# Patient Record
Sex: Female | Born: 1960
Health system: Southern US, Community
[De-identification: ages and names within clinical notes are randomized; demographics above are authoritative.]

## PROBLEM LIST (undated history)

## (undated) DIAGNOSIS — M659 Synovitis and tenosynovitis, unspecified: Secondary | ICD-10-CM

## (undated) DIAGNOSIS — M797 Fibromyalgia: Secondary | ICD-10-CM

## (undated) DIAGNOSIS — K219 Gastro-esophageal reflux disease without esophagitis: Secondary | ICD-10-CM

## (undated) HISTORY — PX: TUBAL LIGATION: SHX77

## (undated) HISTORY — PX: CHOLECYSTECTOMY: SHX55

## (undated) HISTORY — PX: APPENDECTOMY: SHX54

## (undated) HISTORY — DX: Synovitis and tenosynovitis, unspecified: M65.9

## (undated) HISTORY — DX: Gastro-esophageal reflux disease without esophagitis: K21.9

## (undated) HISTORY — DX: Fibromyalgia: M79.7

---

## 1997-12-05 ENCOUNTER — Ambulatory Visit (HOSPITAL_COMMUNITY): Admission: RE | Admit: 1997-12-05 | Discharge: 1997-12-05 | Payer: Self-pay | Admitting: Obstetrics & Gynecology

## 2000-10-10 ENCOUNTER — Other Ambulatory Visit: Admission: RE | Admit: 2000-10-10 | Discharge: 2000-10-10 | Payer: Self-pay | Admitting: Obstetrics & Gynecology

## 2000-10-26 ENCOUNTER — Other Ambulatory Visit: Admission: RE | Admit: 2000-10-26 | Discharge: 2000-10-26 | Payer: Self-pay | Admitting: Obstetrics & Gynecology

## 2000-11-01 ENCOUNTER — Encounter: Payer: Self-pay | Admitting: Obstetrics & Gynecology

## 2000-11-01 ENCOUNTER — Encounter: Admission: RE | Admit: 2000-11-01 | Discharge: 2000-11-01 | Payer: Self-pay | Admitting: Obstetrics & Gynecology

## 2001-12-14 ENCOUNTER — Encounter: Payer: Self-pay | Admitting: Obstetrics & Gynecology

## 2001-12-14 ENCOUNTER — Encounter: Admission: RE | Admit: 2001-12-14 | Discharge: 2001-12-14 | Payer: Self-pay | Admitting: Obstetrics & Gynecology

## 2002-12-11 ENCOUNTER — Other Ambulatory Visit: Admission: RE | Admit: 2002-12-11 | Discharge: 2002-12-11 | Payer: Self-pay | Admitting: Obstetrics & Gynecology

## 2002-12-31 ENCOUNTER — Ambulatory Visit (HOSPITAL_COMMUNITY): Admission: RE | Admit: 2002-12-31 | Discharge: 2002-12-31 | Payer: Self-pay | Admitting: Obstetrics & Gynecology

## 2002-12-31 ENCOUNTER — Encounter: Payer: Self-pay | Admitting: Obstetrics & Gynecology

## 2007-02-16 ENCOUNTER — Encounter: Admission: RE | Admit: 2007-02-16 | Discharge: 2007-02-16 | Payer: Self-pay | Admitting: Obstetrics & Gynecology

## 2009-05-18 ENCOUNTER — Encounter: Admission: RE | Admit: 2009-05-18 | Discharge: 2009-05-18 | Payer: Self-pay | Admitting: Obstetrics & Gynecology

## 2010-05-30 ENCOUNTER — Encounter: Payer: Self-pay | Admitting: Obstetrics & Gynecology

## 2012-12-19 ENCOUNTER — Other Ambulatory Visit: Payer: Self-pay | Admitting: Obstetrics & Gynecology

## 2012-12-19 DIAGNOSIS — R928 Other abnormal and inconclusive findings on diagnostic imaging of breast: Secondary | ICD-10-CM

## 2013-01-03 ENCOUNTER — Other Ambulatory Visit: Payer: Self-pay

## 2013-01-22 ENCOUNTER — Ambulatory Visit
Admission: RE | Admit: 2013-01-22 | Discharge: 2013-01-22 | Disposition: A | Discharge status: Home / Self Care | Payer: BC Managed Care – PPO | Source: Ambulatory Visit | Attending: Obstetrics & Gynecology | Admitting: Obstetrics & Gynecology

## 2013-01-22 DIAGNOSIS — R928 Other abnormal and inconclusive findings on diagnostic imaging of breast: Secondary | ICD-10-CM

## 2016-08-11 ENCOUNTER — Ambulatory Visit (INDEPENDENT_AMBULATORY_CARE_PROVIDER_SITE_OTHER): Payer: BLUE CROSS/BLUE SHIELD

## 2016-08-11 ENCOUNTER — Ambulatory Visit (INDEPENDENT_AMBULATORY_CARE_PROVIDER_SITE_OTHER): Payer: BLUE CROSS/BLUE SHIELD | Admitting: Physician Assistant

## 2016-08-11 VITALS — BP 108/74 | HR 94 | Temp 98.1°F | Resp 16 | Ht 65.75 in | Wt 170.2 lb

## 2016-08-11 DIAGNOSIS — M25562 Pain in left knee: Secondary | ICD-10-CM | POA: Diagnosis not present

## 2016-08-11 LAB — POCT CBC
GRANULOCYTE PERCENT: 50.1 % (ref 37–80)
HEMATOCRIT: 38.4 % (ref 37.7–47.9)
Hemoglobin: 13.3 g/dL (ref 12.2–16.2)
Lymph, poc: 2.6 (ref 0.6–3.4)
MCH, POC: 31.3 pg — AB (ref 27–31.2)
MCHC: 34.6 g/dL (ref 31.8–35.4)
MCV: 90.6 fL (ref 80–97)
MID (CBC): 0.4 (ref 0–0.9)
MPV: 9.7 fL (ref 0–99.8)
POC GRANULOCYTE: 3.1 (ref 2–6.9)
POC LYMPH %: 43 % (ref 10–50)
POC MID %: 6.9 %M (ref 0–12)
Platelet Count, POC: 180 10*3/uL (ref 142–424)
RBC: 4.24 M/uL (ref 4.04–5.48)
RDW, POC: 13.9 %
WBC: 6.1 10*3/uL (ref 4.6–10.2)

## 2016-08-11 MED ORDER — TRAMADOL-ACETAMINOPHEN 37.5-325 MG PO TABS: 1.0000 | ORAL_TABLET | Freq: Four times a day (QID) | ORAL | 0 refills | Status: DC | PRN

## 2016-08-11 MED ORDER — NAPROXEN 500 MG PO TABS: 500.0000 mg | ORAL_TABLET | Freq: Two times a day (BID) | ORAL | 0 refills | Status: DC

## 2016-08-11 NOTE — Patient Instructions (Signed)
     IF you received an x-ray today, you will receive an invoice from [facility]. Please contact [facility] at [phone] with questions or concerns regarding your invoice.   IF you received labwork today, you will receive an invoice from [facility]. Please contact [facility] at [phone] with questions or concerns regarding your invoice.   Our billing staff will not be able to assist you with questions regarding bills from these companies.  You will be contacted with the lab results as soon as they are available. The fastest way to get your results is to activate your My Chart account. Instructions are located on the last page of this paperwork. If you have not heard from us regarding the results in 2 weeks, please contact this office.    We recommend that you schedule a mammogram for breast cancer screening. Typically, you do not need a referral to do this. Please contact a local imaging center to schedule your mammogram.  [facility] - [phone]  *ask for the Radiology Department [facility] ([facility]) - [phone] or [phone]  [facility] - [phone] [facility] - [phone] [facility] - [phone]  *ask for the Radiology Department [facility] - [phone]  *ask for the Radiology Department [facility] - [phone]  *ask for the Mammography Department [facility] - [phone] 

## 2016-08-11 NOTE — Progress Notes (Signed)
08/11/2016 3:19 PM   DOB: 01-31-1961 / MRN: [id number]  SUBJECTIVE:  Shelby Wilkerson is a 56 y.o. female presenting for left medial knee swelling that started a week ago.  There was not trauma involved that she can remember.  She associates medial left knee pain as well.  Moving the knee makes the pain worse and sitting to standing makes the pain worse as well. Denies any previous injury or problems with the knee. She has tried advil 400 mg and this did not really make a difference.  She has also tried ice and topical cream.   She has No Known Allergies.   She  has no past medical history on file.    She  reports that she has never smoked. She has never used smokeless tobacco. She reports that she does not drink alcohol or use drugs. She  has no sexual activity history on file. The patient  has a past surgical history that includes Tubal ligation and Cholecystectomy.  Her family history includes Diabetes in her father, mother, and sister.  Review of Systems  Constitutional: Negative for fever.  Cardiovascular: Negative for leg swelling.  Musculoskeletal: Positive for joint pain. Negative for back pain, falls, myalgias and neck pain.  Skin: Negative for rash.  Neurological: Negative for dizziness.    The problem list and medications were reviewed and updated by myself where necessary and exist elsewhere in the encounter.   OBJECTIVE:  BP 108/74 (BP Location: Right Arm, Patient Position: Sitting, Cuff Size: Normal)   Pulse 94   Temp 98.1 F (36.7 C) (Oral)   Resp 16   Ht 5' 5.75" (1.67 m)   Wt 170 lb 3.2 oz (77.2 kg)   SpO2 98%   BMI 27.68 kg/m   Physical Exam  Constitutional: She is active.  Non-toxic appearance.  Cardiovascular: Normal rate.   Pulses:      Popliteal pulses are 2+ on the left side.       Dorsalis pedis pulses are 2+ on the left side.       Posterior tibial pulses are 2+ on the left side.  Pulmonary/Chest: Effort normal. No tachypnea.  Musculoskeletal:  She exhibits no edema.       Left knee: She exhibits decreased range of motion, swelling (mild) and bony tenderness. She exhibits no ecchymosis, no deformity and no erythema. Tenderness found. Medial joint line tenderness noted.       Legs: Neurological: She is alert.  Skin: Skin is warm and dry. She is not diaphoretic. No pallor.  Vitals reviewed.   Results for orders placed or performed in visit on 08/11/16 (from the past 72 hour(s))  POCT CBC     Status: Abnormal   Collection Time: 08/11/16  2:56 PM  Result Value Ref Range   WBC 6.1 4.6 - 10.2 K/uL   Lymph, poc 2.6 0.6 - 3.4   POC LYMPH PERCENT 43.0 10 - 50 %L   MID (cbc) 0.4 0 - 0.9   POC MID % 6.9 0 - 12 %M   POC Granulocyte 3.1 2 - 6.9   Granulocyte percent 50.1 37 - 80 %G   RBC 4.24 4.04 - 5.48 M/uL   Hemoglobin 13.3 12.2 - 16.2 g/dL   HCT, POC 38.4 37.7 - 47.9 %   MCV 90.6 80 - 97 fL   MCH, POC 31.3 (A) 27 - 31.2 pg   MCHC 34.6 31.8 - 35.4 g/dL   RDW, POC 13.9 %   Platelet Count, POC  180 142 - 424 K/uL   MPV 9.7 0 - 99.8 fL    Dg Knee 1-2 Views Left  Result Date: 08/11/2016 CLINICAL DATA:  Medial left knee pain for 1 week. No previous injury. EXAM: LEFT KNEE - 1-2 VIEW COMPARISON:  None. FINDINGS: No fracture.  No bone lesion. Knee joint is normally spaced and aligned.  No arthropathic change. No joint effusion. The soft tissues are unremarkable. IMPRESSION: Negative. Electronically Signed   By: Lajean Manes M.D.   On: 08/11/2016 14:44    ASSESSMENT AND PLAN:  Vivien was seen today for leg pain.  Diagnoses and all orders for this visit:  Acute pain of left knee -     Basic metabolic panel -     Uric acid -     DG Knee 1-2 Views Left; Future -     POCT CBC  Tenderness of knee, left: Rads normal.  I suspect she may have a soft tissue injury.  CBC negative for infection and no rash or warmth about the knee to suggest gout or infection. Will treat symptomatically for now and see her back in about 5 days so I can  properly examine her knee. MRI would not be out of the question if there is no improvement by that time.  -     naproxen (NAPROSYN) 500 MG tablet; Take 1 tablet (500 mg total) by mouth 2 (two) times daily with a meal. -     traMADol-acetaminophen (ULTRACET) 37.5-325 MG tablet; Take 1 tablet by mouth every 6 (six) hours as needed.    The patient is advised to call or return to clinic if she does not see an improvement in symptoms, or to seek the care of the closest emergency department if she worsens with the above plan.   Philis Fendt, MHS, PA-C [facility] 08/11/2016 3:19 PM

## 2016-08-12 LAB — BASIC METABOLIC PANEL
BUN / CREAT RATIO: 27 — AB (ref 9–23)
BUN: 17 mg/dL (ref 6–24)
CO2: 28 mmol/L (ref 18–29)
CREATININE: 0.64 mg/dL (ref 0.57–1.00)
Calcium: 9.4 mg/dL (ref 8.7–10.2)
Chloride: 100 mmol/L (ref 96–106)
GFR, EST AFRICAN AMERICAN: 116 mL/min/{1.73_m2} (ref >59)
GFR, EST NON AFRICAN AMERICAN: 101 mL/min/{1.73_m2} (ref >59)
Glucose: 75 mg/dL (ref 65–99)
Potassium: 4.2 mmol/L (ref 3.5–5.2)
Sodium: 142 mmol/L (ref 134–144)

## 2016-08-12 LAB — URIC ACID: Uric Acid: 3.9 mg/dL (ref 2.5–7.1)

## 2016-08-12 NOTE — Progress Notes (Signed)
Please send a letter.  Negative for gout.  Elevated bun/creatine to me suggest a mild dehydration.

## 2016-08-23 ENCOUNTER — Ambulatory Visit (INDEPENDENT_AMBULATORY_CARE_PROVIDER_SITE_OTHER): Payer: BLUE CROSS/BLUE SHIELD | Admitting: Physician Assistant

## 2016-08-23 ENCOUNTER — Encounter: Payer: Self-pay | Admitting: Physician Assistant

## 2016-08-23 VITALS — BP 127/81 | HR 95 | Temp 98.1°F | Resp 18 | Ht 65.75 in | Wt 167.6 lb

## 2016-08-23 DIAGNOSIS — H9312 Tinnitus, left ear: Secondary | ICD-10-CM | POA: Diagnosis not present

## 2016-08-23 MED ORDER — CETIRIZINE-PSEUDOEPHEDRINE ER 5-120 MG PO TB12: 1.0000 | ORAL_TABLET | Freq: Two times a day (BID) | ORAL | 1 refills | Status: DC

## 2016-08-23 NOTE — Progress Notes (Signed)
08/25/2016 8:56 AM   DOB: 15-Feb-1961 / MRN: [id number]  SUBJECTIVE:  Shelby Wilkerson is a 56 y.o. female presenting for sharp left ear pain that started near the end of last year.  Has been seen multiple times for this and has tried po abx which it the only thing that helped, ear flushing, Ibuprofen, abx ear drops, none of which worked. Tells me that she has pulsing tinnitus in the ear as well.  She has not tried a nasal decongestant.   She has No Known Allergies.   She  has no past medical history on file.    She  reports that she has never smoked. She has never used smokeless tobacco. She reports that she does not drink alcohol or use drugs. She  has no sexual activity history on file. The patient  has a past surgical history that includes Tubal ligation and Cholecystectomy.  Her family history includes Diabetes in her father, mother, and sister.  Review of Systems  Constitutional: Negative for chills, diaphoresis and fever.  Gastrointestinal: Negative for nausea.  Skin: Negative for rash.  Neurological: Negative for dizziness.    The problem list and medications were reviewed and updated by myself where necessary and exist elsewhere in the encounter.   OBJECTIVE:  BP 127/81   Pulse 95   Temp 98.1 F (36.7 C) (Oral)   Resp 18   Ht 5' 5.75" (1.67 m)   Wt 167 lb 9.6 oz (76 kg)   SpO2 100%   BMI 27.26 kg/m   Physical Exam  Constitutional: She is active.  HENT:  Right Ear: Hearing, tympanic membrane, external ear and ear canal normal.  Left Ear: Hearing, tympanic membrane, external ear and ear canal normal.  Nose: Nose normal. Right sinus exhibits no maxillary sinus tenderness and no frontal sinus tenderness. Left sinus exhibits no maxillary sinus tenderness and no frontal sinus tenderness.  Mouth/Throat: Uvula is midline, oropharynx is clear and moist and mucous membranes are normal. Mucous membranes are not dry. No oropharyngeal exudate, posterior oropharyngeal edema or  tonsillar abscesses.  Weber lateralizing to the left.  B>A on the left.   Cardiovascular: Normal rate.   Pulmonary/Chest: Effort normal. No tachypnea.  Lymphadenopathy:       Head (right side): No submandibular and no tonsillar adenopathy present.       Head (left side): No submandibular and no tonsillar adenopathy present.    She has no cervical adenopathy.  Neurological: She is alert.  Skin: Skin is warm.    Results for orders placed or performed in visit on 08/23/16 (from the past 72 hour(s))  Sedimentation Rate     Status: None   Collection Time: 08/23/16 12:53 PM  Result Value Ref Range   Sed Rate 16 0 - 40 mm/hr    No results found.  ASSESSMENT AND PLAN:  Shelby Wilkerson was seen today for ear pain.  Diagnoses and all orders for this visit:  Tinnitus of left ear: Given pulsatile nature I think she needs to see ENT and possibly neurology, however this still may be ETD.  Will try nasal decongestant.  -     Sedimentation Rate -     cetirizine-pseudoephedrine (ZYRTEC-D) 5-120 MG tablet; Take 1 tablet by mouth 2 (two) times daily.    The patient is advised to call or return to clinic if she does not see an improvement in symptoms, or to seek the care of the closest emergency department if she worsens with the above plan.  Philis Fendt, MHS, PA-C [facility] 08/25/2016 8:56 AM

## 2016-08-23 NOTE — Patient Instructions (Addendum)
  Stay on your flonase.  Starte zyrtec-d twice daily for at least five days.  If you improve with this then no need to see ENT.  If no improvement after five days then stop the zyrtec-d.  If you sed rate comes back elevated or if you have pain in the ear we can strongly consider an antibiotic.    IF you received an x-ray today, you will receive an invoice from [facility]. Please contact [facility] at [phone] with questions or concerns regarding your invoice.   IF you received labwork today, you will receive an invoice from [facility]. Please contact [facility] at [phone] with questions or concerns regarding your invoice.   Our billing staff will not be able to assist you with questions regarding bills from these companies.  You will be contacted with the lab results as soon as they are available. The fastest way to get your results is to activate your My Chart account. Instructions are located on the last page of this paperwork. If you have not heard from [place] regarding the results in 2 weeks, please contact this office.

## 2016-08-24 LAB — SEDIMENTATION RATE: Sed Rate: 16 mm/hr (ref 0–40)

## 2016-08-25 ENCOUNTER — Encounter: Payer: Self-pay | Admitting: Radiology

## 2016-08-25 NOTE — Progress Notes (Signed)
Please make patient aware of results via letter. In the context of her overall presentation any abnormal values are of no clinical significance.  Philis Fendt PA-C, 08/25/2016 10:04 AM

## 2016-10-25 ENCOUNTER — Other Ambulatory Visit: Payer: Self-pay | Admitting: Obstetrics & Gynecology

## 2016-10-25 DIAGNOSIS — Z1231 Encounter for screening mammogram for malignant neoplasm of breast: Secondary | ICD-10-CM

## 2016-11-08 ENCOUNTER — Ambulatory Visit
Admission: RE | Admit: 2016-11-08 | Discharge: 2016-11-08 | Disposition: A | Discharge status: Home / Self Care | Payer: BLUE CROSS/BLUE SHIELD | Source: Ambulatory Visit | Attending: Obstetrics & Gynecology | Admitting: Obstetrics & Gynecology

## 2016-11-08 DIAGNOSIS — Z1231 Encounter for screening mammogram for malignant neoplasm of breast: Secondary | ICD-10-CM

## 2019-03-06 ENCOUNTER — Other Ambulatory Visit: Payer: Self-pay | Admitting: Obstetrics & Gynecology

## 2019-03-06 DIAGNOSIS — Z1231 Encounter for screening mammogram for malignant neoplasm of breast: Secondary | ICD-10-CM

## 2019-04-24 ENCOUNTER — Ambulatory Visit
Admission: RE | Admit: 2019-04-24 | Discharge: 2019-04-24 | Disposition: A | Discharge status: Home / Self Care | Payer: BLUE CROSS/BLUE SHIELD | Source: Ambulatory Visit | Attending: Obstetrics & Gynecology | Admitting: Obstetrics & Gynecology

## 2019-04-24 ENCOUNTER — Other Ambulatory Visit: Payer: Self-pay

## 2019-04-24 DIAGNOSIS — Z1231 Encounter for screening mammogram for malignant neoplasm of breast: Secondary | ICD-10-CM

## 2019-09-13 ENCOUNTER — Ambulatory Visit (INDEPENDENT_AMBULATORY_CARE_PROVIDER_SITE_OTHER): Payer: BC Managed Care – PPO

## 2019-09-13 ENCOUNTER — Encounter: Payer: Self-pay | Admitting: Podiatry

## 2019-09-13 ENCOUNTER — Ambulatory Visit: Payer: BC Managed Care – PPO | Admitting: Podiatry

## 2019-09-13 ENCOUNTER — Other Ambulatory Visit: Payer: Self-pay

## 2019-09-13 VITALS — Temp 98.5°F

## 2019-09-13 DIAGNOSIS — M722 Plantar fascial fibromatosis: Secondary | ICD-10-CM | POA: Diagnosis not present

## 2019-09-13 DIAGNOSIS — M778 Other enthesopathies, not elsewhere classified: Secondary | ICD-10-CM

## 2019-09-13 MED ORDER — DICLOFENAC SODIUM 75 MG PO TBEC: 75.0000 mg | DELAYED_RELEASE_TABLET | Freq: Two times a day (BID) | ORAL | 2 refills | Status: DC

## 2019-09-13 NOTE — Patient Instructions (Signed)

## 2019-09-16 NOTE — Progress Notes (Signed)
Subjective:   Patient ID: Shelby Wilkerson, female   DOB: 59 y.o.   MRN: [id number]   HPI Patient presents stating around a year ago she injured her left foot and its been sore on the bottom.  Patient states that she is tried different treatment options it throbs and pressure when she walks patient does not smoke and does like to be active   Review of Systems  All other systems reviewed and are negative.       Objective:  Physical Exam Vitals and nursing note reviewed.  Constitutional:      Appearance: She is well-developed.  Pulmonary:     Effort: Pulmonary effort is normal.  Musculoskeletal:        General: Normal range of motion.  Skin:    General: Skin is warm.  Neurological:     Mental Status: She is alert.     Neurovascular status intact muscle strength found to be adequate range of motion within normal limits.  Patient does have mild equinus condition is noted to have acute discomfort in the plantar aspect of the left heel at the insertional point of the tendon into the calcaneus with inflammation fluid around the medial band and patient is noted to have good digital perfusion well oriented x3     Assessment:  Acute plantar fasciitis left with inflammation fluid medial band with moderate depression the arch and medical equinus condition     Plan:  H&P conditions reviewed and today I did sterile prep and injected the plantar fascial left 3 mg Kenalog 5 mg Xylocaine and applied fascial brace with instructions on usage.  Gave instructions for supportive shoes and reappoint for [place] to recheck  X-rays indicate small spur no indication to stress fracture or arthritis

## 2019-09-19 ENCOUNTER — Ambulatory Visit (INDEPENDENT_AMBULATORY_CARE_PROVIDER_SITE_OTHER): Payer: BC Managed Care – PPO | Admitting: Podiatry

## 2019-09-19 ENCOUNTER — Other Ambulatory Visit: Payer: Self-pay

## 2019-09-19 ENCOUNTER — Encounter: Payer: Self-pay | Admitting: Podiatry

## 2019-09-19 DIAGNOSIS — M722 Plantar fascial fibromatosis: Secondary | ICD-10-CM

## 2019-09-19 NOTE — Progress Notes (Signed)
Subjective:   Patient ID: Shelby Wilkerson, female   DOB: 59 y.o.   MRN: [id number]   HPI Patient states the heel is somewhat improved but still sore left and states that she feels like she needs support   ROS      Objective:  Physical Exam  Neurovascular status intact with inflammation still noted at the medial fascial left with fluid buildup noted with depression of the arch noted     Assessment:  Still dealing with acute plantar fasciitis left with inflammation     Plan:  H&P condition reviewed and went ahead since there is an area of discomfort I did reinject the fascia 3 mg Kenalog 5 mg Xylocaine and I went ahead casted for functional orthotics to try to support the arch and prevent stress on the arch and help the heel.  Patient will be seen back to recheck

## 2019-10-24 ENCOUNTER — Ambulatory Visit (INDEPENDENT_AMBULATORY_CARE_PROVIDER_SITE_OTHER): Payer: BC Managed Care – PPO | Admitting: Orthotics

## 2019-10-24 ENCOUNTER — Other Ambulatory Visit: Payer: Self-pay

## 2019-10-24 DIAGNOSIS — M778 Other enthesopathies, not elsewhere classified: Secondary | ICD-10-CM

## 2019-10-24 DIAGNOSIS — M722 Plantar fascial fibromatosis: Secondary | ICD-10-CM

## 2019-10-24 NOTE — Progress Notes (Signed)
Patient picked up f/o no problmes with fit or function.

## 2020-12-22 ENCOUNTER — Ambulatory Visit: Payer: BC Managed Care – PPO | Admitting: Physician Assistant

## 2020-12-22 ENCOUNTER — Other Ambulatory Visit: Payer: Self-pay

## 2020-12-22 DIAGNOSIS — J0110 Acute frontal sinusitis, unspecified: Secondary | ICD-10-CM

## 2020-12-22 MED ORDER — CETIRIZINE-PSEUDOEPHEDRINE ER 5-120 MG PO TB12: 1.0000 | ORAL_TABLET | Freq: Two times a day (BID) | ORAL | 1 refills | Status: DC

## 2020-12-22 NOTE — Progress Notes (Signed)
Patient reports dental procedure on 7/28 from a bridge being placed. Patient reports clear runny nose Patient denies  N/V/ diarrhea. Patient reports sneezing.

## 2020-12-22 NOTE — Progress Notes (Signed)
New Patient Office Visit  Subjective:  Patient ID: Shelby Wilkerson, female    DOB: [birth year]  Age: 60 y.o. MRN: [id number]  CC:  Chief Complaint  Patient presents with   Sinusitis    HPI Shelby Wilkerson reports that she has been having nasal congestion, frontal headache and sinus pressure onset 28 July.  Reports that she did have a dental procedure completed on 28 July and started feeling symptoms shortly after.  Reports that she did follow-up with her dental provider and they prescribed amoxicillin as well as a muscle relaxer due to pain in her jaw.  Reports that she is still taking the course of antibiotics but has not noticed any improvement.  Reports that she has been taking the antibiotics for a couple of days.  Reports that she has taken 3 home COVID tests that were all negative.  States that she has also tried Alka-Seltzer plus without much relief, has used her Flonase once and use her Zyrtec once without relief.  Does endorse that ibuprofen offer some relief from her headaches.  Denies sick contacts.  Eating and drinking okay.     History reviewed. No pertinent past medical history.  Past Surgical History:  Procedure Laterality Date   CHOLECYSTECTOMY     TUBAL LIGATION      Family History  Problem Relation Age of Onset   Diabetes Mother    Diabetes Father    Diabetes Sister     Social History   Socioeconomic History   Marital status: Married    Spouse name: Not on file   Number of children: Not on file   Years of education: Not on file   Highest education level: Not on file  Occupational History   Not on file  Tobacco Use   Smoking status: Never   Smokeless tobacco: Never  Substance and Sexual Activity   Alcohol use: No   Drug use: No   Sexual activity: Not on file  Other Topics Concern   Not on file  Social History Narrative   Not on file   Social Determinants of Health   Financial Resource Strain: Not on file  Food Insecurity: Not  on file  Transportation Needs: Not on file  Physical Activity: Not on file  Stress: Not on file  Social Connections: Not on file  Intimate Partner Violence: Not on file    ROS Review of Systems  Constitutional:  Negative for chills and fever.  HENT:  Positive for congestion, postnasal drip, sinus pressure, sinus pain and sneezing. Negative for facial swelling, sore throat and trouble swallowing.   Eyes: Negative.   Respiratory:  Positive for cough. Negative for shortness of breath.   Cardiovascular:  Negative for chest pain.  Gastrointestinal:  Negative for nausea and vomiting.  Endocrine: Negative.   Genitourinary: Negative.   Musculoskeletal: Negative.   Skin: Negative.   Allergic/Immunologic: Negative.   Neurological:  Positive for headaches.  Hematological: Negative.   Psychiatric/Behavioral: Negative.     Objective:   Today's Vitals: BP 122/68 (BP Location: Left Arm, Patient Position: Sitting, Cuff Size: Normal)   Pulse 97   Temp 98.2 F (36.8 C) (Oral)   Resp 18   Ht '5\' 5"'$  (1.651 m)   Wt 182 lb (82.6 kg)   SpO2 99%   BMI 30.29 kg/m   Physical Exam Vitals and nursing note reviewed.  Constitutional:      General: She is not in acute distress.    Appearance: Normal appearance.  She is not ill-appearing.  HENT:     Head: Normocephalic and atraumatic.     Right Ear: Tympanic membrane, ear canal and external ear normal.     Left Ear: Tympanic membrane, ear canal and external ear normal.     Nose:     Right Turbinates: Swollen.     Left Turbinates: Swollen.     Right Sinus: Maxillary sinus tenderness and frontal sinus tenderness present.     Left Sinus: Maxillary sinus tenderness and frontal sinus tenderness present.     Mouth/Throat:     Mouth: Mucous membranes are moist.     Pharynx: Oropharynx is clear.     Tonsils: No tonsillar exudate.  Eyes:     Extraocular Movements: Extraocular movements intact.     Conjunctiva/sclera: Conjunctivae normal.     Pupils:  Pupils are equal, round, and reactive to light.  Cardiovascular:     Rate and Rhythm: Normal rate and regular rhythm.     Pulses: Normal pulses.     Heart sounds: Normal heart sounds.  Pulmonary:     Effort: Pulmonary effort is normal.     Breath sounds: Normal breath sounds.  Musculoskeletal:        General: Normal range of motion.     Cervical back: Normal range of motion and neck supple.  Lymphadenopathy:     Cervical: No cervical adenopathy.  Skin:    General: Skin is warm and dry.  Neurological:     General: No focal deficit present.     Mental Status: She is alert and oriented to person, place, and time.  Psychiatric:        Mood and Affect: Mood normal.        Behavior: Behavior normal.        Thought Content: Thought content normal.        Judgment: Judgment normal.    Assessment & Plan:   Problem List Items Addressed This Visit   None Visit Diagnoses     Acute non-recurrent frontal sinusitis       Relevant Medications   amoxicillin (AMOXIL) 500 MG capsule   cetirizine-pseudoephedrine (ZYRTEC-D) 5-120 MG tablet       Outpatient Encounter Medications as of 12/22/2020  Medication Sig   amoxicillin (AMOXIL) 500 MG capsule Take 500 mg by mouth 2 (two) times daily.   fluticasone (FLONASE) 50 MCG/ACT nasal spray fluticasone propionate 50 mcg/actuation nasal spray,suspension  Spray 2 sprays every day by intranasal route at bedtime.   Ibuprofen (ADVIL PO) Take by mouth as needed.   [DISCONTINUED] cetirizine-pseudoephedrine (ZYRTEC-D) 5-120 MG tablet Take 1 tablet by mouth 2 (two) times daily.   [DISCONTINUED] valACYclovir (VALTREX) 1000 MG tablet valacyclovir 1 gram tablet  TK 1 T PO TID FOR 7 DAYS   cetirizine-pseudoephedrine (ZYRTEC-D) 5-120 MG tablet Take 1 tablet by mouth 2 (two) times daily.   diclofenac (VOLTAREN) 75 MG EC tablet Take 1 tablet (75 mg total) by mouth 2 (two) times daily. (Patient not taking: Reported on 12/22/2020)   meloxicam (MOBIC) 7.5 MG  tablet Take 7.5 mg by mouth 2 (two) times daily.   No facility-administered encounter medications on file as of 12/22/2020.  1. Acute non-recurrent frontal sinusitis Reassurance given, encouraged patient to continue antibiotic regimen as prescribed by dental provider.  Strongly encouraged supportive care with Zyrtec and Flonase, increased hydration, ibuprofen as needed.  Red flags given for prompt reevaluation. - cetirizine-pseudoephedrine (ZYRTEC-D) 5-120 MG tablet; Take 1 tablet by mouth 2 (two) times daily.  Dispense: 30 tablet; Refill: 1   I have reviewed the patient's medical history (PMH, PSH, Social History, Family History, Medications, and allergies) , and have been updated if relevant. I spent 19 minutes reviewing chart and  face to face time with patient.   Follow-up: Return if symptoms worsen or fail to improve.   Loraine Grip Mayers, PA-C

## 2020-12-22 NOTE — Patient Instructions (Addendum)
I encourage you to continue with your antibiotic treatment as prescribed, I encourage you to use the Flonase and Zyrtec as we discussed.  Make sure you are drinking plenty of fluids and get lots of rest.  Please let us know if there is anything else we can do for you.  Kennieth Rad, PA-C Physician Assistant [facility] Medicine [email or web address]   Sinusitis, Adult Sinusitis is inflammation of your sinuses. Sinuses are hollow spaces in the bones around your face. Your sinuses are located: Around your eyes. In the middle of your forehead. Behind your nose. In your cheekbones. Mucus normally drains out of your sinuses. When your nasal tissues become inflamed or swollen, mucus can become trapped or blocked. This allows bacteria, viruses, and fungi to grow, which leads to infection. Most infections of thesinuses are caused by a virus. Sinusitis can develop quickly. It can last for up to 4 weeks (acute) or for more than 12 weeks (chronic). Sinusitis often develops after a cold. What are the causes? This condition is caused by anything that creates swelling in the sinuses or stops mucus from draining. This includes: Allergies. Asthma. Infection from bacteria or viruses. Deformities or blockages in your nose or sinuses. Abnormal growths in the nose (nasal polyps). Pollutants, such as chemicals or irritants in the air. Infection from fungi (rare). What increases the risk? You are more likely to develop this condition if you: Have a weak body defense system (immune system). Do a lot of swimming or diving. Overuse nasal sprays. Smoke. What are the signs or symptoms? The main symptoms of this condition are pain and a feeling of pressure around the affected sinuses. Other symptoms include: Stuffy nose or congestion. Thick drainage from your nose. Swelling and warmth over the affected sinuses. Headache. Upper toothache. A cough that may get  worse at night. Extra mucus that collects in the throat or the back of the nose (postnasal drip). Decreased sense of smell and taste. Fatigue. A fever. Sore throat. Bad breath. How is this diagnosed? This condition is diagnosed based on: Your symptoms. Your medical history. A physical exam. Tests to find out if your condition is acute or chronic. This may include: Checking your nose for nasal polyps. Viewing your sinuses using a device that has a light (endoscope). Testing for allergies or bacteria. Imaging tests, such as an MRI or CT scan. In rare cases, a bone biopsy may be done to rule out more serious types offungal sinus disease. How is this treated? Treatment for sinusitis depends on the cause and whether your condition is chronic or acute. If caused by a virus, your symptoms should go away on their own within 10 days. You may be given medicines to relieve symptoms. They include: Medicines that shrink swollen nasal passages (topical intranasal decongestants). Medicines that treat allergies (antihistamines). A spray that eases inflammation of the nostrils (topical intranasal corticosteroids). Rinses that help get rid of thick mucus in your nose (nasal saline washes). If caused by bacteria, your health care provider may recommend waiting to see if your symptoms improve. Most bacterial infections will get better without antibiotic medicine. You may be given antibiotics if you have: A severe infection. A weak immune system. If caused by narrow nasal passages or nasal polyps, you may need to have surgery. Follow these instructions at home: Medicines Take, use, or apply over-the-counter and prescription medicines only as told by your health care provider. These may include nasal sprays. If you were prescribed an antibiotic medicine, take  it as told by your health care provider. Do not stop taking the antibiotic even if you start to feel better. Hydrate and humidify  Drink enough  fluid to keep your urine pale yellow. Staying hydrated will help to thin your mucus. Use a cool mist humidifier to keep the humidity level in your home above 50%. Inhale steam for 10-15 minutes, 3-4 times a day, or as told by your health care provider. You can do this in the bathroom while a hot shower is running. Limit your exposure to cool or dry air.  Rest Rest as much as possible. Sleep with your head raised (elevated). Make sure you get enough sleep each night. General instructions  Apply a warm, moist washcloth to your face 3-4 times a day or as told by your health care provider. This will help with discomfort. Wash your hands often with soap and water to reduce your exposure to germs. If soap and water are not available, use hand sanitizer. Do not smoke. Avoid being around people who are smoking (secondhand smoke). Keep all follow-up visits as told by your health care provider. This is important.  Contact a health care provider if: You have a fever. Your symptoms get worse. Your symptoms do not improve within 10 days. Get help right away if: You have a severe headache. You have persistent vomiting. You have severe pain or swelling around your face or eyes. You have vision problems. You develop confusion. Your neck is stiff. You have trouble breathing. Summary Sinusitis is soreness and inflammation of your sinuses. Sinuses are hollow spaces in the bones around your face. This condition is caused by nasal tissues that become inflamed or swollen. The swelling traps or blocks the flow of mucus. This allows bacteria, viruses, and fungi to grow, which leads to infection. If you were prescribed an antibiotic medicine, take it as told by your health care provider. Do not stop taking the antibiotic even if you start to feel better. Keep all follow-up visits as told by your health care provider. This is important. This information is not intended to replace advice given to you by your  health care provider. Make sure you discuss any questions you have with your healthcare provider. Document Revised: 09/25/2017 Document Reviewed: 09/25/2017 Elsevier Patient Education  2022 [employer].

## 2021-02-16 ENCOUNTER — Other Ambulatory Visit: Payer: Self-pay | Admitting: Obstetrics & Gynecology

## 2021-02-16 DIAGNOSIS — Z1231 Encounter for screening mammogram for malignant neoplasm of breast: Secondary | ICD-10-CM

## 2021-02-19 ENCOUNTER — Other Ambulatory Visit (HOSPITAL_BASED_OUTPATIENT_CLINIC_OR_DEPARTMENT_OTHER): Payer: Self-pay

## 2021-02-19 ENCOUNTER — Ambulatory Visit: Payer: BC Managed Care – PPO | Attending: Internal Medicine

## 2021-02-19 DIAGNOSIS — Z23 Encounter for immunization: Secondary | ICD-10-CM

## 2021-02-19 MED ORDER — MODERNA COVID-19 BIVAL BOOSTER 50 MCG/0.5ML IM SUSP
INTRAMUSCULAR | 0 refills | Status: DC
  Filled 2021-02-19: qty 0.5, 1d supply, fill #0

## 2021-02-19 NOTE — Progress Notes (Signed)
   Covid-19 Vaccination Clinic  Name:  Shelby Wilkerson    MRN: [id number] DOB: 05/19/1960  02/19/2021  Shelby Wilkerson was observed post Covid-19 immunization for 15 minutes without incident. She was provided with Vaccine Information Sheet and instruction to access the V-Safe system.   Shelby Wilkerson was instructed to call 911 with any severe reactions post vaccine: Difficulty breathing  Swelling of face and throat  A fast heartbeat  A bad rash all over body  Dizziness and weakness

## 2021-03-04 ENCOUNTER — Encounter: Payer: Self-pay | Admitting: Gastroenterology

## 2021-03-15 ENCOUNTER — Ambulatory Visit: Payer: BC Managed Care – PPO | Admitting: Gastroenterology

## 2021-03-15 ENCOUNTER — Ambulatory Visit (INDEPENDENT_AMBULATORY_CARE_PROVIDER_SITE_OTHER)
Admission: RE | Admit: 2021-03-15 | Discharge: 2021-03-15 | Disposition: A | Discharge status: Home / Self Care | Payer: BC Managed Care – PPO | Source: Ambulatory Visit | Attending: Gastroenterology | Admitting: Gastroenterology

## 2021-03-15 ENCOUNTER — Encounter: Payer: Self-pay | Admitting: Gastroenterology

## 2021-03-15 ENCOUNTER — Other Ambulatory Visit: Payer: Self-pay

## 2021-03-15 VITALS — BP 110/74 | HR 95 | Ht 65.75 in | Wt 181.4 lb

## 2021-03-15 DIAGNOSIS — R062 Wheezing: Secondary | ICD-10-CM

## 2021-03-15 DIAGNOSIS — Z1211 Encounter for screening for malignant neoplasm of colon: Secondary | ICD-10-CM

## 2021-03-15 DIAGNOSIS — R079 Chest pain, unspecified: Secondary | ICD-10-CM | POA: Diagnosis not present

## 2021-03-15 DIAGNOSIS — K219 Gastro-esophageal reflux disease without esophagitis: Secondary | ICD-10-CM | POA: Diagnosis not present

## 2021-03-15 MED ORDER — SUCRALFATE 1 GM/10ML PO SUSP: 1.0000 g | Freq: Four times a day (QID) | ORAL | 1 refills | Status: DC | PRN

## 2021-03-15 MED ORDER — OMEPRAZOLE 40 MG PO CPDR: DELAYED_RELEASE_CAPSULE | ORAL | 3 refills | Status: DC

## 2021-03-15 NOTE — Patient Instructions (Addendum)
If you are age 59 or older, your body mass index should be between 23-30. Your Body mass index is 29.5 kg/m. If this is out of the aforementioned range listed, please consider follow up with your Primary Care Provider.  If you are age 85 or younger, your body mass index should be between 19-25. Your Body mass index is 29.5 kg/m. If this is out of the aformentioned range listed, please consider follow up with your Primary Care Provider.   ________________________________________________________  The [facility] providers would like to encourage you to use [facility] to communicate with providers for non-urgent requests or questions.  Due to long hold times on the telephone, sending your provider a message by [facility] may be a faster and more efficient way to get a response.  Please allow 48 business hours for a response.  Please remember that this is for non-urgent requests.  _______________________________________________________   Your provider has requested that you have a Chest X-ray before leaving today. Please go to the basement floor to our Radiology department for the test.   We have sent the following medications to your pharmacy for you to pick up at your convenience: Omeprazole 40 mg: Take 1 capsule once to twice daily Carafate suspension: Take 10 ml every 6 hours as needed  Please see a Primary Care Provider to schedule an EKG as soon as possible and let us know the outcome: [phone].  We will schedule you for a procedure once this has been determined.  Thank you for entrusting me with your care and for choosing [facility], Dr. [place] Cellar

## 2021-03-15 NOTE — Progress Notes (Signed)
HPI :  60 year old female with a history of GERD, constipation, history of appendectomy, self-referred here for symptoms of chest pain.  She states she has had reflux for a "long time", several years.  She states her main symptoms typically are regurgitation and pyrosis.  She has not really been using much of antacids for this over the years other than trying some papaya enzymes keeping the head of her bed elevated.  Since the first week of October however she has had change in symptoms.  She feels like there is a "knife", sharp knife going into her chest and radiates into her back.  This tends to come and go, is present a lot of times during the day.  She denies any shortness of breath associated with this, although when she lies on her left side she states she hears herself wheezing which is new for her.  She thinks certain foods may sometimes make this worse such as alcohol or eating spicy foods.  No odynophagia.  No dysphagia.  She was given a trial of Prilosec 20 mg once daily over the past 2 weeks.  She states that sharp stabbing pain is not as bad and she does think this has helped but symptoms persist.  Her back continues to bother her.  She has had problems with her sinuses recently given a course of ampicillin.  She exercises on the elliptical frequently without any chest pains.  She denies any exertional dyspnea.  She has no family history of cardiac disease and has no personal history of known coronary disease.  She states this is different from her normal reflux symptoms, much more severe.  She denies any dysphagia.  She is never had a prior EGD.  No abdominal pain.  No nausea or vomiting.  She has baseline mild constipation uses fiber Gummies which she states helps keep her regular.  No blood in her stools.  No family history of GI malignancies.  She has never had a colonoscopy.  She had a Cologuard in 2019 which was negative.  She inquires about other screening options.  Her heart rate is  in the 90s on exam today and saturating 98%.  She states she currently trying to establish with a new primary care.  She has no EKG on file.  She has never seen a cardiologist.   Cologuard 04/02/2018 - negative  Past Medical History:  Diagnosis Date   Fibromyalgia    GERD (gastroesophageal reflux disease)    Tenosynovitis of fingers      Past Surgical History:  Procedure Laterality Date   APPENDECTOMY     CHOLECYSTECTOMY     TUBAL LIGATION     Family History  Problem Relation Age of Onset   Diabetes Mother    Diabetes Father    Diabetes Sister    Colon cancer Neg Hx    Esophageal cancer Neg Hx    Pancreatic cancer Neg Hx    Stomach cancer Neg Hx    Liver disease Neg Hx    Social History   Tobacco Use   Smoking status: Never   Smokeless tobacco: Never  Substance Use Topics   Alcohol use: No   Drug use: No   Current Outpatient Medications  Medication Sig Dispense Refill   omeprazole (PRILOSEC) 40 MG capsule Take 1 capsule by mouth once to twice daily 60 capsule 3   sucralfate (CARAFATE) 1 GM/10ML suspension Take 10 mLs (1 g total) by mouth every 6 (six) hours as needed. Dana  mL 1   No current facility-administered medications for this visit.   No Known Allergies   Review of Systems: All systems reviewed and negative except where noted in HPI.    Physical Exam: BP 110/74   Pulse 95   Ht 5' 5.75" (1.67 m)   Wt 181 lb 6.4 oz (82.3 kg)   BMI 29.50 kg/m  pulse ox 98% RA Constitutional: Pleasant,well-developed, female in no acute distress. HEENT: Normocephalic and atraumatic. Conjunctivae are normal. No scleral icterus. Neck supple.  Cardiovascular: Normal rate, regular rhythm.  Pulmonary/chest: Effort normal. Right side clear, left side with some course expiratory BS LUL. No wheezing,  Abdominal: Soft, nondistended, nontender.There are no masses palpable.  Extremities: no edema Lymphadenopathy: No cervical adenopathy noted. Neurological: Alert and oriented  to person place and time. Skin: Skin is warm and dry. No rashes noted. Psychiatric: Normal mood and affect. Behavior is normal.   ASSESSMENT AND PLAN: 60 year old female here for new patient assessment of the following:  GERD Chest pain Wheezing Colon cancer screening  Patient with longstanding intermittent reflux symptoms for which she is not on any antacids, now with a few weeks worth of more severe mid chest discomfort with radiation to her back, subjective sense of wheezing.  Her vital signs are stable in the clinic today, pulse ox normal, she does have slight coarse BS in LUL.  It is certainly possible that reflux / esophagitis could be causing discomfort like this however I do think she needs some basic cardiopulmonary work-up to make sure nothing else concerning is going on.  While she is able to exercise pretty well without any exertional symptoms or this precipitating her symptoms, and symptoms now ongoing for few weeks, I do recommend she touch base with her primary care and have at least a basic EKG done and defer to them if they think anything else needs to be pursued from a cardiovascular perspective (we don't have EKG capability in our office).  I will obtain a chest x-ray today.  We will change her omeprazole to 40 mg once to twice daily and start her empirically on liquid Carafate 10 cc every 6 hours as needed and see if that helps.  If cleared by her PCP then recommend we proceed with an EGD if her symptoms persist.  I discussed EGD, risks and benefits with her and she wishes to proceed if she needs to.  Obviously if her symptoms significantly improved with higher dose PPI and Carafate that would argue GERD/esophagitis is a likely cause / contributor.  We otherwise discussed colon cancer screening options, she is never had an optical colonoscopy and recommend pursuing that when she is cleared to have that done, she is agreeable after discussing options.  Plan: - recommend she see her  PCP for basic EKG and determination if further cardiovascular workup is needed - CXR PA / LAT today - increase omeprazole to 40mg  once to twice daily - add liquid carafate 10cc po q 6 hours PRN - EGD and colonoscopy pending course after evaluation by PCP. Will reach out to patient in next day or so to ensure she has followed up with PCP if not already done  Jolly Mango, MD [facility]

## 2021-03-17 ENCOUNTER — Telehealth: Payer: Self-pay

## 2021-03-17 ENCOUNTER — Other Ambulatory Visit: Payer: Self-pay

## 2021-03-17 DIAGNOSIS — R079 Chest pain, unspecified: Secondary | ICD-10-CM

## 2021-03-17 NOTE — Telephone Encounter (Signed)
Called and spoke to patient. She would like to schedule her ECL but also wants a referral to Cardiology. She saw Dr. Dwyane Dee at [facility] for the EKG. They said she can see [facility] but recommended that we refer her.  Per her request I Scheduled her for an ECL on 12-23 at 3:00pm. Ok to refer to [facility]?

## 2021-03-17 NOTE — Telephone Encounter (Signed)
Okay to refer to cardiology. Thank you Jan

## 2021-03-17 NOTE — Progress Notes (Signed)
Per SA Ok to refer to [facility] Cardiology for chest pain

## 2021-03-17 NOTE — Telephone Encounter (Signed)
See telephone encounter for appt details and further information.

## 2021-03-17 NOTE — Telephone Encounter (Signed)
-----   Message from Yetta Flock, MD sent at 03/15/2021  5:33 PM EST ----- Regarding: follow up Jan can you please touch base with this patient and a day or 2 and see if she is able to touch base with primary care and get checked out for her chest pain?  Thanks

## 2021-03-17 NOTE — Telephone Encounter (Signed)
Noted, thanks!

## 2021-03-17 NOTE — Telephone Encounter (Signed)
Referral placed to [facility] - Any [facility] location

## 2021-03-23 ENCOUNTER — Ambulatory Visit: Payer: BC Managed Care – PPO

## 2021-04-19 ENCOUNTER — Ambulatory Visit (HOSPITAL_BASED_OUTPATIENT_CLINIC_OR_DEPARTMENT_OTHER): Payer: BC Managed Care – PPO | Admitting: Cardiovascular Disease

## 2021-04-19 ENCOUNTER — Encounter (HOSPITAL_BASED_OUTPATIENT_CLINIC_OR_DEPARTMENT_OTHER): Payer: Self-pay | Admitting: Cardiovascular Disease

## 2021-04-19 ENCOUNTER — Other Ambulatory Visit: Payer: Self-pay

## 2021-04-19 DIAGNOSIS — K219 Gastro-esophageal reflux disease without esophagitis: Secondary | ICD-10-CM | POA: Diagnosis not present

## 2021-04-19 DIAGNOSIS — R0789 Other chest pain: Secondary | ICD-10-CM

## 2021-04-19 HISTORY — DX: Other chest pain: R07.89

## 2021-04-19 HISTORY — DX: Gastro-esophageal reflux disease without esophagitis: K21.9

## 2021-04-19 NOTE — Assessment & Plan Note (Signed)
She had chest pain one month ago that has improved with treatment of GERD.  She exercises regularly and has no chest pain.  No indication for further ischemic evaluation at this time.

## 2021-04-19 NOTE — Patient Instructions (Addendum)
Medication Instructions:  ?Your physician recommends that you continue on your current medications as directed. Please refer to the Current Medication list given to you today.  ? ?Labwork: ?NONE ? ?Testing/Procedures: ?NONE ? ?Follow-Up: ?AS NEEDED  ? ?  ?

## 2021-04-19 NOTE — Assessment & Plan Note (Signed)
Symptoms have been better lately and she has no more chest pain.

## 2021-04-19 NOTE — Progress Notes (Signed)
Cardiology Office Note:    Date:  04/19/2021   ID:  Shelby Wilkerson, DOB Apr 19, 1961, MRN [id number]  PCP:  Vania Rea, MD  Cardiologist:  None    Referring MD: Yetta Flock, MD   No chief complaint on file.   History of Present Illness:    Shelby Wilkerson is a 60 y.o. female with a hx of GERD ad fibromyalgia here today for evaluation of chest pain. She saw GI 03/2021 and complained of a sharp intermittent pain. They were concerned there may be a cardiac cause of her chest pain. Today, she is doing well. One month ago, she had a sharp chest pain that made breathing difficult. The pain occurred while she was sitting down. She was worried about heart issues because her 19 year old daughter has congestive heart failure. However, since getting her acid reflux under control, she has not had another episode of chest pain. She will take omeprazole and a lemon ginger candy occasionally for the acid reflux. Last month, she reports a notably different difficulty breathing. When she lay down, she could hear a wheezing from her nose. She also lost her sense of taste and smell. She had received the COVID booster earlier and tested negative. After taking prednisone, her smell and taste returned. Recently, she noticed swelling on her lateral R ankle. Of note, she fractured her R ankle years ago. For exercise, she uses her home elliptical for 30 minutes and goes to the gym. 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 With the extra free time, she has been going to the gym more often. She denies any palpitations, lightheadedness, headaches, syncope, orthopnea, PND, or exertional symptoms.  Past Medical History:  Diagnosis Date   Atypical chest pain 04/19/2021   Fibromyalgia    GERD (gastroesophageal reflux disease)    GERD (gastroesophageal reflux disease) 04/19/2021   Tenosynovitis of fingers     Past Surgical History:  Procedure Laterality Date    APPENDECTOMY     CHOLECYSTECTOMY     TUBAL LIGATION      Current Medications: No outpatient medications have been marked as taking for the 04/19/21 encounter (Office Visit) with Skeet Latch, MD.     Allergies:   Patient has no known allergies.   Social History   Socioeconomic History   Marital status: Married    Spouse name: Not on file   Number of children: Not on file   Years of education: Not on file   Highest education level: Not on file  Occupational History   Not on file  Tobacco Use   Smoking status: Never   Smokeless tobacco: Never  Substance and Sexual Activity   Alcohol use: No   Drug use: No   Sexual activity: Not on file  Other Topics Concern   Not on file  Social History Narrative   Not on file   Social Determinants of Health   Financial Resource Strain: Low Risk    Difficulty of Paying Living Expenses: Not hard at all  Food Insecurity: No Food Insecurity   Worried About Running Out of Food in the Last Year: Never true   [place] in the Last Year: Never true  Transportation Needs: No Transportation Needs   Lack of Transportation (Medical): No   Lack of Transportation (Non-Medical): No  Physical Activity: Insufficiently Active   Days of Exercise per Week: 3 days   Minutes of Exercise per Session: 30 min  Stress: Not on file  Social Connections: Not on file     Family History: The patient's family history includes Diabetes in her father, mother, and sister; Heart failure in her daughter. There is no history of Colon cancer, Esophageal cancer, Pancreatic cancer, Stomach cancer, or Liver disease.  ROS:   Please see the history of present illness.  (+) Acid reflux (+) Shortness of breath (+) R ankle swelling   All other systems reviewed and negative.   EKGs/Labs/Other Studies Reviewed:    The following studies were reviewed today: Chest X-Ray 03/15/21 FINDINGS: The heart size and mediastinal contours are within normal  limits. Both lungs are clear. The visualized skeletal structures are unremarkable. IMPRESSION: No active cardiopulmonary disease.  EKG:   04/19/21: Sinus tachycardia, rate 102 bpm  Recent Labs: No results found for requested labs within last 8760 hours.   Recent Lipid Panel No results found for: CHOL, TRIG, HDL, CHOLHDL, VLDL, LDLCALC, LDLDIRECT  CHA2DS2-VASc Score =   [ ] .  Therefore, the patient's annual risk of stroke is   %.        Physical Exam:    VS:  BP 114/88 (BP Location: Right Arm, Patient Position: Sitting, Cuff Size: Large)   Pulse (!) 102   Ht 5\' 5"  (1.651 m)   Wt 187 lb 9.6 oz (85.1 kg)   BMI 31.22 kg/m  , BMI Body mass index is 31.22 kg/m. GENERAL:  Well appearing HEENT: Pupils equal round and reactive, fundi not visualized, oral mucosa unremarkable NECK:  No jugular venous distention, waveform within normal limits, carotid upstroke brisk and symmetric, no bruits, no thyromegaly LUNGS:  Clear to auscultation bilaterally HEART:  RRR.  PMI not displaced or sustained,S1 and S2 within normal limits, no S3, no S4, no clicks, no rubs, no murmurs ABD:  Flat, positive bowel sounds normal in frequency in pitch, no bruits, no rebound, no guarding, no midline pulsatile mass, no hepatomegaly, no splenomegaly EXT:  2 plus pulses throughout, no edema, no cyanosis no clubbing SKIN:  No rashes no nodules NEURO:  Cranial nerves II through XII grossly intact, motor grossly intact throughout PSYCH:  Cognitively intact, oriented to person place and time   ASSESSMENT:    1. Gastroesophageal reflux disease, unspecified whether esophagitis present   2. Atypical chest pain    PLAN:   GERD (gastroesophageal reflux disease) Symptoms have been better lately and she has no more chest pain.  Atypical chest pain She had chest pain one month ago that has improved with treatment of GERD.  She exercises regularly and has no chest pain.  No indication for further ischemic evaluation  at this time.   In order of problems listed above:      Medication Adjustments/Labs and Tests Ordered: Current medicines are reviewed at length with the patient today.  Concerns regarding medicines are outlined above.  Orders Placed This Encounter  Procedures   EKG 12-Lead    No orders of the defined types were placed in this encounter.  Disposition: FU with Leotis Isham C. Oval Linsey, MD, [facility] prn  Wilhemina Bonito as a scribe for Skeet Latch, MD.,have documented all relevant documentation on the behalf of Skeet Latch, MD,as directed by  Skeet Latch, MD while in the presence of Skeet Latch, MD.  I, [place] Oval Linsey, MD have reviewed all documentation for this visit.  The documentation of the exam, diagnosis, procedures, and orders on 04/19/2021 are all accurate and complete.   Signed, Skeet Latch, MD  04/19/2021 4:15 PM  [facility]

## 2021-04-20 ENCOUNTER — Telehealth: Payer: Self-pay

## 2021-04-20 DIAGNOSIS — Z1211 Encounter for screening for malignant neoplasm of colon: Secondary | ICD-10-CM

## 2021-04-20 DIAGNOSIS — K219 Gastro-esophageal reflux disease without esophagitis: Secondary | ICD-10-CM

## 2021-04-20 MED ORDER — PEG 3350-KCL-NA BICARB-NACL 420 G PO SOLR: 4000.0000 mL | ORAL | 0 refills | Status: DC

## 2021-04-20 NOTE — Telephone Encounter (Signed)
Randel Books, CMA has informed patient that prep was sent in and instructions are available in her my chart. See alternate telephone encounter for details.

## 2021-04-20 NOTE — Telephone Encounter (Signed)
Patient saw cardiology yesterday and has been Cleared to proceed with ECL with Dr. Havery Moros. Called and spoke to patient.  It appears that [facility], Dr. Doyne Keel nurse has done her instructions and sent her prep to [facility].  I let patient know she can check her MyChart for her instructions and call us if she has any questions.

## 2021-04-20 NOTE — Telephone Encounter (Signed)
-----   Message from Roetta Sessions, [place] sent at 03/29/2021  9:18 AM EST ----- Regarding: Cardiology clearance ? Patient has appointment with Cardiology on 12-12.  Check to make sure she is ok to proceed with ECL on 12-23. Patient needs instructions, prep, amb ref.Marland KitchenMarland Kitchen

## 2021-04-20 NOTE — Telephone Encounter (Signed)
Lm on vm for patient to return call.   Patient is already scheduled for an endo/colon with Dr. Havery Moros in the [facility] on Friday, 04/30/21 at 3 pm. Will send prep to pharmacy and will send instructions to patient via my chart.   Ambulatory referral to GI in epic.

## 2021-04-20 NOTE — Telephone Encounter (Signed)
-----   Message from Yetta Flock, MD sent at 04/19/2021  4:56 PM EST ----- Herbert Seta this patient was cleared by cardiology. Okay to proceed with scheduling EGD and colonoscopy if she wants to proceed with that. Thanks  ----- Message ----- From: Skeet Latch, MD Sent: 04/19/2021   4:15 PM EST To: Yetta Flock, MD

## 2021-04-23 NOTE — Telephone Encounter (Signed)
Pt called in today stating that she was returning my call. Advised that Jan had already informed her about the same thing. Advised that I did not have any new information for her. Advised that she does need to pick up her prep this week and that her instructions are available in my chart. Pt verbalized understanding and had no concerns at the end of the call.

## 2021-04-30 ENCOUNTER — Encounter: Payer: Self-pay | Admitting: Gastroenterology

## 2021-04-30 ENCOUNTER — Ambulatory Visit (AMBULATORY_SURGERY_CENTER): Payer: BC Managed Care – PPO | Admitting: Gastroenterology

## 2021-04-30 VITALS — BP 118/70 | HR 88 | Temp 96.6°F | Resp 20 | Ht 65.0 in | Wt 181.0 lb

## 2021-04-30 DIAGNOSIS — B9681 Helicobacter pylori [H. pylori] as the cause of diseases classified elsewhere: Secondary | ICD-10-CM

## 2021-04-30 DIAGNOSIS — K2970 Gastritis, unspecified, without bleeding: Secondary | ICD-10-CM

## 2021-04-30 DIAGNOSIS — D122 Benign neoplasm of ascending colon: Secondary | ICD-10-CM | POA: Diagnosis not present

## 2021-04-30 DIAGNOSIS — K219 Gastro-esophageal reflux disease without esophagitis: Secondary | ICD-10-CM

## 2021-04-30 DIAGNOSIS — D123 Benign neoplasm of transverse colon: Secondary | ICD-10-CM | POA: Diagnosis not present

## 2021-04-30 DIAGNOSIS — K449 Diaphragmatic hernia without obstruction or gangrene: Secondary | ICD-10-CM

## 2021-04-30 DIAGNOSIS — Z1211 Encounter for screening for malignant neoplasm of colon: Secondary | ICD-10-CM | POA: Diagnosis not present

## 2021-04-30 DIAGNOSIS — K2950 Unspecified chronic gastritis without bleeding: Secondary | ICD-10-CM

## 2021-04-30 MED ORDER — SODIUM CHLORIDE 0.9 % IV SOLN: 500.0000 mL | Freq: Once | INTRAVENOUS | Status: DC

## 2021-04-30 NOTE — Patient Instructions (Addendum)
Handouts given for polyps and Hiatal Hernia.  YOU HAD AN ENDOSCOPIC PROCEDURE TODAY AT [facility]:   Refer to the procedure report that was given to you for any specific questions about what was found during the examination.  If the procedure report does not answer your questions, please call your gastroenterologist to clarify.  If you requested that your care partner not be given the details of your procedure findings, then the procedure report has been included in a sealed envelope for you to review at your convenience later.  YOU SHOULD EXPECT: Some feelings of bloating in the abdomen. Passage of more gas than usual.  Walking can help get rid of the air that was put into your GI tract during the procedure and reduce the bloating. If you had a lower endoscopy (such as a colonoscopy or flexible sigmoidoscopy) you may notice spotting of blood in your stool or on the toilet paper. If you underwent a bowel prep for your procedure, you may not have a normal bowel movement for a few days.  Please Note:  You might notice some irritation and congestion in your nose or some drainage.  This is from the oxygen used during your procedure.  There is no need for concern and it should clear up in a day or so.  SYMPTOMS TO REPORT IMMEDIATELY:  Following lower endoscopy (colonoscopy):  Excessive amounts of blood in the stool  Significant tenderness or worsening of abdominal pains  Swelling of the abdomen that is new, acute  Fever of 100F or higher  Following upper endoscopy (EGD)  Vomiting of blood or coffee ground material  New chest pain or pain under the shoulder blades  Painful or persistently difficult swallowing  New shortness of breath  Black, tarry-looking stools  For urgent or emergent issues, a gastroenterologist can be reached at any hour by calling [phone]. Do not use MyChart messaging for urgent concerns.   DIET:  We do recommend a small meal at first, but then you  may proceed to your regular diet.  Drink plenty of fluids but you should avoid alcoholic beverages for 24 hours.  ACTIVITY:  You should plan to take it easy for the rest of today and you should NOT DRIVE or use heavy machinery until tomorrow (because of the sedation medicines used during the test).    FOLLOW UP: Our staff will call the number listed on your records 48-72 hours following your procedure to check on you and address any questions or concerns that you may have regarding the information given to you following your procedure. If we do not reach you, we will leave a message.  We will attempt to reach you two times.  During this call, we will ask if you have developed any symptoms of COVID 19. If you develop any symptoms (ie: fever, flu-like symptoms, shortness of breath, cough etc.) before then, please call [phone].  If you test positive for Covid 19 in the 2 weeks post procedure, please call and report this information to [place].    If any biopsies were taken you will be contacted by phone or by letter within the next 1-3 weeks.  Please call us at [phone] if you have not heard about the biopsies in 3 weeks.    SIGNATURES/CONFIDENTIALITY: You and/or your care partner have signed paperwork which will be entered into your electronic medical record.  These signatures attest to the fact that that the information above on your After Visit Summary  has been reviewed and is understood.  Full responsibility of the confidentiality of this discharge information lies with you and/or your care-partner.

## 2021-04-30 NOTE — Op Note (Signed)
[facility] Patient Name: Shelby Wilkerson Procedure Date: 04/30/2021 2:44 PM MRN: [id number] Endoscopist: Remo Lipps P. Havery Moros , MD Age: 60 Referring MD:  Date of Birth: Oct 25, 1960 Gender: Female Account #: [id number] Procedure:                Upper GI endoscopy Indications:              Follow-up of suspected gastro-esophageal reflux                            disease causing chest discomfort. Improved                            significantly with PPI (has since stopped                            omeprazole routinely) Medicines:                Monitored Anesthesia Care Procedure:                Pre-Anesthesia Assessment:                           - Prior to the procedure, a History and Physical                            was performed, and patient medications and                            allergies were reviewed. The patient's tolerance of                            previous anesthesia was also reviewed. The risks                            and benefits of the procedure and the sedation                            options and risks were discussed with the patient.                            All questions were answered, and informed consent                            was obtained. Prior Anticoagulants: The patient has                            taken no previous anticoagulant or antiplatelet                            agents. ASA Grade Assessment: II - A patient with                            mild systemic disease. After reviewing the risks  and benefits, the patient was deemed in                            satisfactory condition to undergo the procedure.                           After obtaining informed consent, the endoscope was                            passed under direct vision. Throughout the                            procedure, the patient's blood pressure, pulse, and                            oxygen saturations were monitored  continuously. The                            GIF [id number] was introduced through the                            mouth, and advanced to the second part of duodenum.                            The upper GI endoscopy was accomplished without                            difficulty. The patient tolerated the procedure                            well. Scope In: Scope Out: Findings:                 Esophagogastric landmarks were identified: the                            Z-line was found at 33 cm, the gastroesophageal                            junction was found at 33 cm and the upper extent of                            the gastric folds was found at 35 cm from the                            incisors.                           A 2 cm hiatal hernia was present.                           The exam of the esophagus was otherwise normal.                           Patchy mildly erythematous mucosa  was found in the                            gastric fundus and in the gastric body without                            focal ulceration or erosion.                           The exam of the stomach was otherwise normal.                           Biopsies were taken with a cold forceps in the                            gastric body, at the incisura and in the gastric                            antrum for Helicobacter pylori testing.                           The duodenal bulb and second portion of the                            duodenum were normal. Complications:            No immediate complications. Estimated blood loss:                            Minimal. Estimated Blood Loss:     Estimated blood loss was minimal. Impression:               - Esophagogastric landmarks identified.                           - 2 cm hiatal hernia.                           - Normal esophagus otherwise                           - Erythematous mucosa in the gastric fundus and                            gastric body.                            - Normal stomach otherwise - biopsies taken to rule                            out H pylori                           - Normal duodenal bulb and second portion of the  duodenum. Recommendation:           - Patient has a contact number available for                            emergencies. The signs and symptoms of potential                            delayed complications were discussed with the                            patient. Return to normal activities tomorrow.                            Written discharge instructions were provided to the                            patient.                           - Resume previous diet.                           - Continue present medications.                           - Continue omeprazole as needed                           - Minimize NSAID use                           - Await pathology results. Remo Lipps P. Havery Moros, MD 04/30/2021 3:33:33 PM This report has been signed electronically.

## 2021-04-30 NOTE — Progress Notes (Signed)
[facility] History and Physical   Primary Care Physician:  Vania Rea, MD   Reason for Procedure:   GERd, history of chest pain, colon cancer screening  Plan:    EGD and colonoscopy     HPI: Shelby Wilkerson is a 60 y.o. female  here for colonoscopy screening - first time exam. Also with history of chest pains and GERD - placed on omeprazole with improvement, has not taken it daily as recommended. Seen cardiology who did not recommended further workup for cardiac causes. No family history of colon cancer known. Otherwise feels well without any cardiopulmonary symptoms.    Past Medical History:  Diagnosis Date   Atypical chest pain 04/19/2021   Fibromyalgia    GERD (gastroesophageal reflux disease)    GERD (gastroesophageal reflux disease) 04/19/2021   Tenosynovitis of fingers     Past Surgical History:  Procedure Laterality Date   APPENDECTOMY     CHOLECYSTECTOMY     TUBAL LIGATION      Prior to Admission medications   Not on File    No current outpatient medications on file.   Current Facility-Administered Medications  Medication Dose Route Frequency Provider Last Rate Last Admin   0.9 %  sodium chloride infusion  500 mL Intravenous Once Lilee Aldea, Carlota Raspberry, MD        Allergies as of 04/30/2021   (No Known Allergies)    Family History  Problem Relation Age of Onset   Diabetes Mother    Diabetes Father    Diabetes Sister    Heart failure Daughter    Colon cancer Neg Hx    Esophageal cancer Neg Hx    Pancreatic cancer Neg Hx    Stomach cancer Neg Hx    Liver disease Neg Hx     Social History   Socioeconomic History   Marital status: Married    Spouse name: Not on file   Number of children: Not on file   Years of education: Not on file   Highest education level: Not on file  Occupational History   Not on file  Tobacco Use   Smoking status: Never   Smokeless tobacco: Never  Substance and Sexual Activity   Alcohol use: No    Drug use: No   Sexual activity: Not on file  Other Topics Concern   Not on file  Social History Narrative   Not on file   Social Determinants of Health   Financial Resource Strain: Low Risk    Difficulty of Paying Living Expenses: Not hard at all  Food Insecurity: No Food Insecurity   Worried About Charity fundraiser in the Last Year: Never true   Ran Out of Food in the Last Year: Never true  Transportation Needs: No Transportation Needs   Lack of Transportation (Medical): No   Lack of Transportation (Non-Medical): No  Physical Activity: Insufficiently Active   Days of Exercise per Week: 3 days   Minutes of Exercise per Session: 30 min  Stress: Not on file  Social Connections: Not on file  Intimate Partner Violence: Not on file    Review of Systems: All other review of systems negative except as mentioned in the HPI.  Physical Exam: Vital signs BP (!) 147/87    Pulse 99    Temp (!) 96.6 F (35.9 C)    Ht 5\' 5"  (1.651 m)    Wt 181 lb (82.1 kg)    SpO2 98%    BMI 30.12 kg/m  General:   Alert,  Well-developed, pleasant and cooperative in NAD Lungs:  Clear throughout to auscultation.   Heart:  Regular rate and rhythm Abdomen:  Soft, nontender and nondistended.   Neuro/Psych:  Alert and cooperative. Normal mood and affect. A and O x 3  Jolly Mango, MD [facility]

## 2021-04-30 NOTE — Op Note (Signed)
[facility] Patient Name: Shelby Wilkerson Procedure Date: 04/30/2021 2:44 PM MRN: [id number] Endoscopist: Remo Lipps P. Havery Moros , MD Age: 60 Referring MD:  Date of Birth: 20-Nov-1960 Gender: Female Account #: [id number] Procedure:                Colonoscopy Indications:              Screening for colorectal malignant neoplasm, This                            is the patient's first colonoscopy Medicines:                Monitored Anesthesia Care Procedure:                Pre-Anesthesia Assessment:                           - Prior to the procedure, a History and Physical                            was performed, and patient medications and                            allergies were reviewed. The patient's tolerance of                            previous anesthesia was also reviewed. The risks                            and benefits of the procedure and the sedation                            options and risks were discussed with the patient.                            All questions were answered, and informed consent                            was obtained. Prior Anticoagulants: The patient has                            taken no previous anticoagulant or antiplatelet                            agents. ASA Grade Assessment: II - A patient with                            mild systemic disease. After reviewing the risks                            and benefits, the patient was deemed in                            satisfactory condition to undergo the procedure.  After obtaining informed consent, the colonoscope                            was passed under direct vision. Throughout the                            procedure, the patient's blood pressure, pulse, and                            oxygen saturations were monitored continuously. The                            Olympus Colonoscope [id number] was introduced through                            the anus  and advanced to the the cecum, identified                            by appendiceal orifice and ileocecal valve. The                            colonoscopy was performed without difficulty. The                            patient tolerated the procedure well. The quality                            of the bowel preparation was good. The ileocecal                            valve, appendiceal orifice, and rectum were                            photographed. Scope In: 3:03:28 PM Scope Out: 3:25:28 PM Scope Withdrawal Time: 0 hours 19 minutes 3 seconds  Total Procedure Duration: 0 hours 22 minutes 0 seconds  Findings:                 The perianal and digital rectal examinations were                            normal.                           Two sessile polyps were found in the ascending                            colon. The polyps were 3 to 4 mm in size. These                            polyps were removed with a cold snare. Resection                            and retrieval were complete.  A 4 mm polyp was found in the transverse colon. The                            polyp was sessile. The polyp was removed with a                            cold snare. Resection and retrieval were complete.                           Anal papilla(e) were hypertrophied.                           The exam was otherwise without abnormality. Complications:            No immediate complications. Estimated blood loss:                            Minimal. Estimated Blood Loss:     Estimated blood loss was minimal. Impression:               - Two 3 to 4 mm polyps in the ascending colon,                            removed with a cold snare. Resected and retrieved.                           - One 4 mm polyp in the transverse colon, removed                            with a cold snare. Resected and retrieved.                           - Anal papilla(e) were hypertrophied.                            - The examination was otherwise normal. Recommendation:           - Patient has a contact number available for                            emergencies. The signs and symptoms of potential                            delayed complications were discussed with the                            patient. Return to normal activities tomorrow.                            Written discharge instructions were provided to the                            patient.                           -  Resume previous diet.                           - Continue present medications.                           - Await pathology results. Remo Lipps P. Havery Moros, MD 04/30/2021 3:29:24 PM This report has been signed electronically.

## 2021-04-30 NOTE — Progress Notes (Signed)
Called to room to assist during endoscopic procedure.  Patient ID and intended procedure confirmed with present staff. Received instructions for my participation in the procedure from the performing physician.  

## 2021-04-30 NOTE — Progress Notes (Signed)
VS-CW 

## 2021-04-30 NOTE — Progress Notes (Signed)
Sedate, gd SR, tolerated procedure well, VSS, report to RN 

## 2021-05-05 ENCOUNTER — Telehealth: Payer: Self-pay | Admitting: *Deleted

## 2021-05-05 NOTE — Telephone Encounter (Signed)
°  Follow up Call-  Call back number 04/30/2021  Post procedure Call Back phone  # [phone]  Permission to leave phone message Yes  Some recent data might be hidden     Patient questions:  Do you have a fever, pain , or abdominal swelling? No. Pain Score  0 *  Have you tolerated food without any problems? Yes.    Have you been able to return to your normal activities? Yes.    Do you have any questions about your discharge instructions: Diet   No. Medications  No. Follow up visit  No.  Do you have questions or concerns about your Care? No.  Actions: * If pain score is 4 or above: No action needed, pain <4.

## 2021-05-11 ENCOUNTER — Other Ambulatory Visit: Payer: Self-pay

## 2021-05-11 DIAGNOSIS — A048 Other specified bacterial intestinal infections: Secondary | ICD-10-CM

## 2021-05-11 MED ORDER — OMEPRAZOLE 20 MG PO CPDR: 20.0000 mg | DELAYED_RELEASE_CAPSULE | Freq: Two times a day (BID) | ORAL | 0 refills | Status: AC

## 2021-05-11 MED ORDER — AMOXICILLIN 500 MG PO TABS: 1000.0000 mg | ORAL_TABLET | Freq: Two times a day (BID) | ORAL | 0 refills | Status: AC

## 2021-05-11 MED ORDER — CLARITHROMYCIN 500 MG PO TABS: 500.0000 mg | ORAL_TABLET | Freq: Two times a day (BID) | ORAL | 0 refills | Status: AC

## 2021-05-11 MED ORDER — METRONIDAZOLE 500 MG PO TABS: 500.0000 mg | ORAL_TABLET | Freq: Two times a day (BID) | ORAL | 0 refills | Status: AC

## 2021-06-17 ENCOUNTER — Telehealth: Payer: Self-pay

## 2021-06-17 NOTE — Telephone Encounter (Signed)
Called patient and asked her to start Holding her Omeprazole today, and then come into our lab in the basement on 07/01/21 or a day or 2 after, for a stool test. Order in [place]. Patient agreed.

## 2021-06-17 NOTE — Telephone Encounter (Signed)
-----   Message from Yevette Edwards, RN sent at 05/11/2021 11:22 AM EST ----- Regarding: PPI Hold Hold Omeprazole 2 weeks prior to H. Pylori stool test on 2/23

## 2021-06-21 ENCOUNTER — Other Ambulatory Visit: Payer: Self-pay | Admitting: Obstetrics & Gynecology

## 2021-06-21 DIAGNOSIS — Z1231 Encounter for screening mammogram for malignant neoplasm of breast: Secondary | ICD-10-CM

## 2021-06-24 ENCOUNTER — Ambulatory Visit
Admission: RE | Admit: 2021-06-24 | Discharge: 2021-06-24 | Disposition: A | Discharge status: Home / Self Care | Payer: BC Managed Care – PPO | Source: Ambulatory Visit

## 2021-06-24 DIAGNOSIS — Z1231 Encounter for screening mammogram for malignant neoplasm of breast: Secondary | ICD-10-CM

## 2021-07-01 ENCOUNTER — Other Ambulatory Visit: Payer: BC Managed Care – PPO

## 2021-07-01 ENCOUNTER — Telehealth: Payer: Self-pay

## 2021-07-01 DIAGNOSIS — A048 Other specified bacterial intestinal infections: Secondary | ICD-10-CM

## 2021-07-01 NOTE — Telephone Encounter (Signed)
Spoke with patient to remind her that she is due for H. Pylori stool test at this time. No appointment is necessary. Patient is aware that she can stop by the lab in the basement at her convenience to pick up stool kit and instructions. Pt states that she will be submitting stool sample today. Pt knows that we will be in contact with the results. Patient verbalized understanding and had no concerns at the end of the call.

## 2021-07-01 NOTE — Telephone Encounter (Signed)
-----   Message from Yevette Edwards, RN sent at 05/11/2021 11:17 AM EST ----- Regarding: Labs H. Pylori stool antigen test due on 07/01/21 or after

## 2021-07-03 LAB — H. PYLORI ANTIGEN, STOOL: H pylori Ag, Stl: NEGATIVE

## 2021-10-07 ENCOUNTER — Other Ambulatory Visit (HOSPITAL_BASED_OUTPATIENT_CLINIC_OR_DEPARTMENT_OTHER): Payer: Self-pay

## 2021-11-13 LAB — GLUCOSE, POCT (MANUAL RESULT ENTRY): POC Glucose: 145 mg/dl — AB (ref 70–99)

## 2022-03-28 ENCOUNTER — Encounter: Payer: Self-pay | Admitting: *Deleted

## 2022-03-29 NOTE — Progress Notes (Signed)
Pt states she has set up Dr. Bernerd Limbo as her PCP; in fact she states this is the first PCP she has actually had and is happy with her care with him and his office at [facility]. She had already seen him once since the 11/13/21 screening event and has another future appt. She thanked the [employer] equity dept for following up to be sure she had the care she needed.

## 2022-08-29 ENCOUNTER — Telehealth: Payer: Self-pay

## 2022-08-29 NOTE — Telephone Encounter (Signed)
Received referral from Tracey Harries MD at [facility] for suspected sleep apnea. Placed in sleep mailbox

## 2022-11-05 LAB — AMB RESULTS CONSOLE CBG: Glucose: 128

## 2022-11-05 NOTE — Progress Notes (Signed)
No SDOH insecurities at this time 

## 2022-11-29 ENCOUNTER — Encounter: Payer: Self-pay | Admitting: *Deleted

## 2022-11-29 NOTE — Progress Notes (Signed)
Pt attended 11/05/22 screening event where her b/p was 102/78 and her blood sugar was 128. At the event, the pt confirmed Dr. Tracey Harries from [facility] as her PCP and did not identify any SDOH insecurities. Chart review indicates pt last saw Dr. Everlene Other on 08/23/22 for her annual physical exam. No additional health equity team support indicated at this time.

## 2022-12-25 IMAGING — DX DG CHEST 2V
2 series · 2 of 2 positions shown · non-contrast
Comparison: None.

CLINICAL DATA: Chest pain for 3 weeks

EXAM:
CHEST - 2 VIEW

[chest pa]
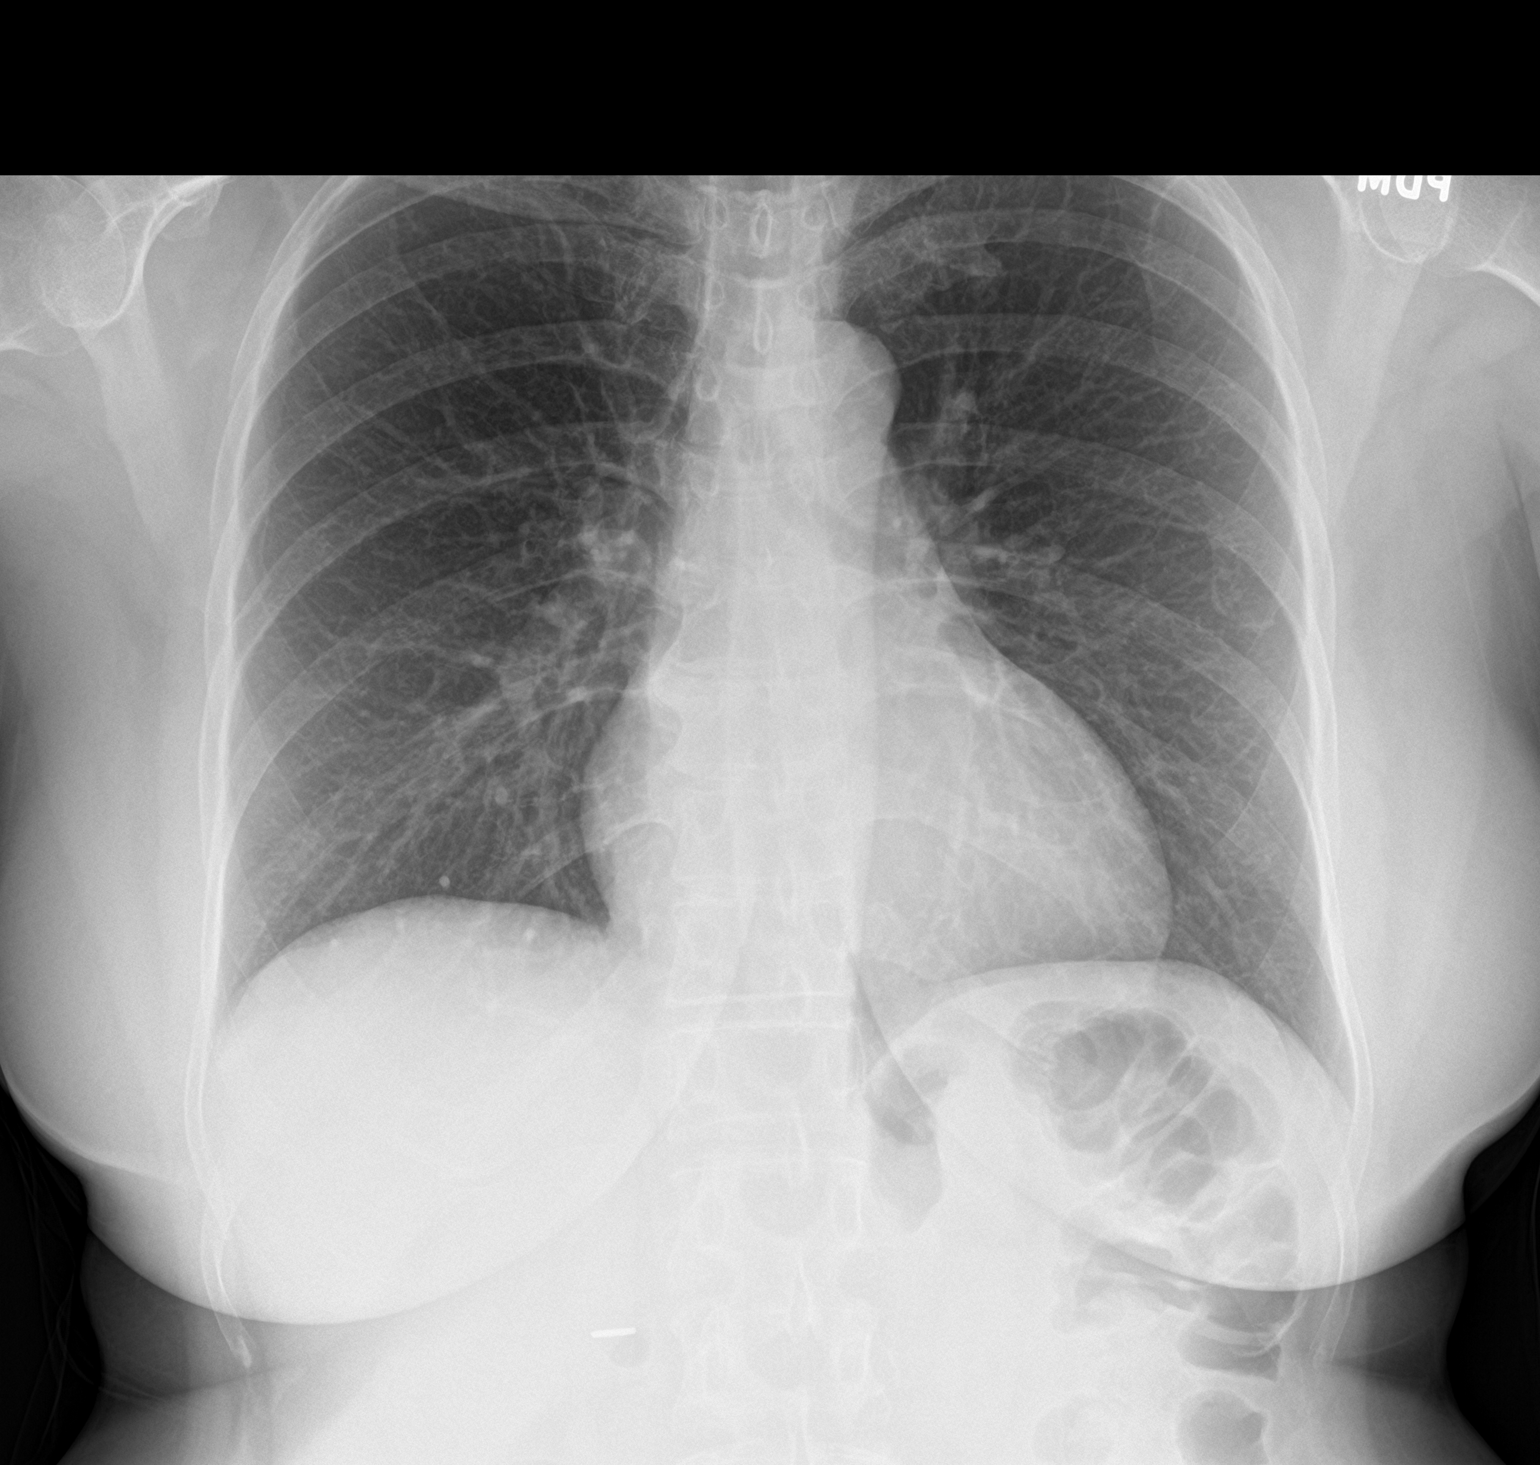

[chest lat]
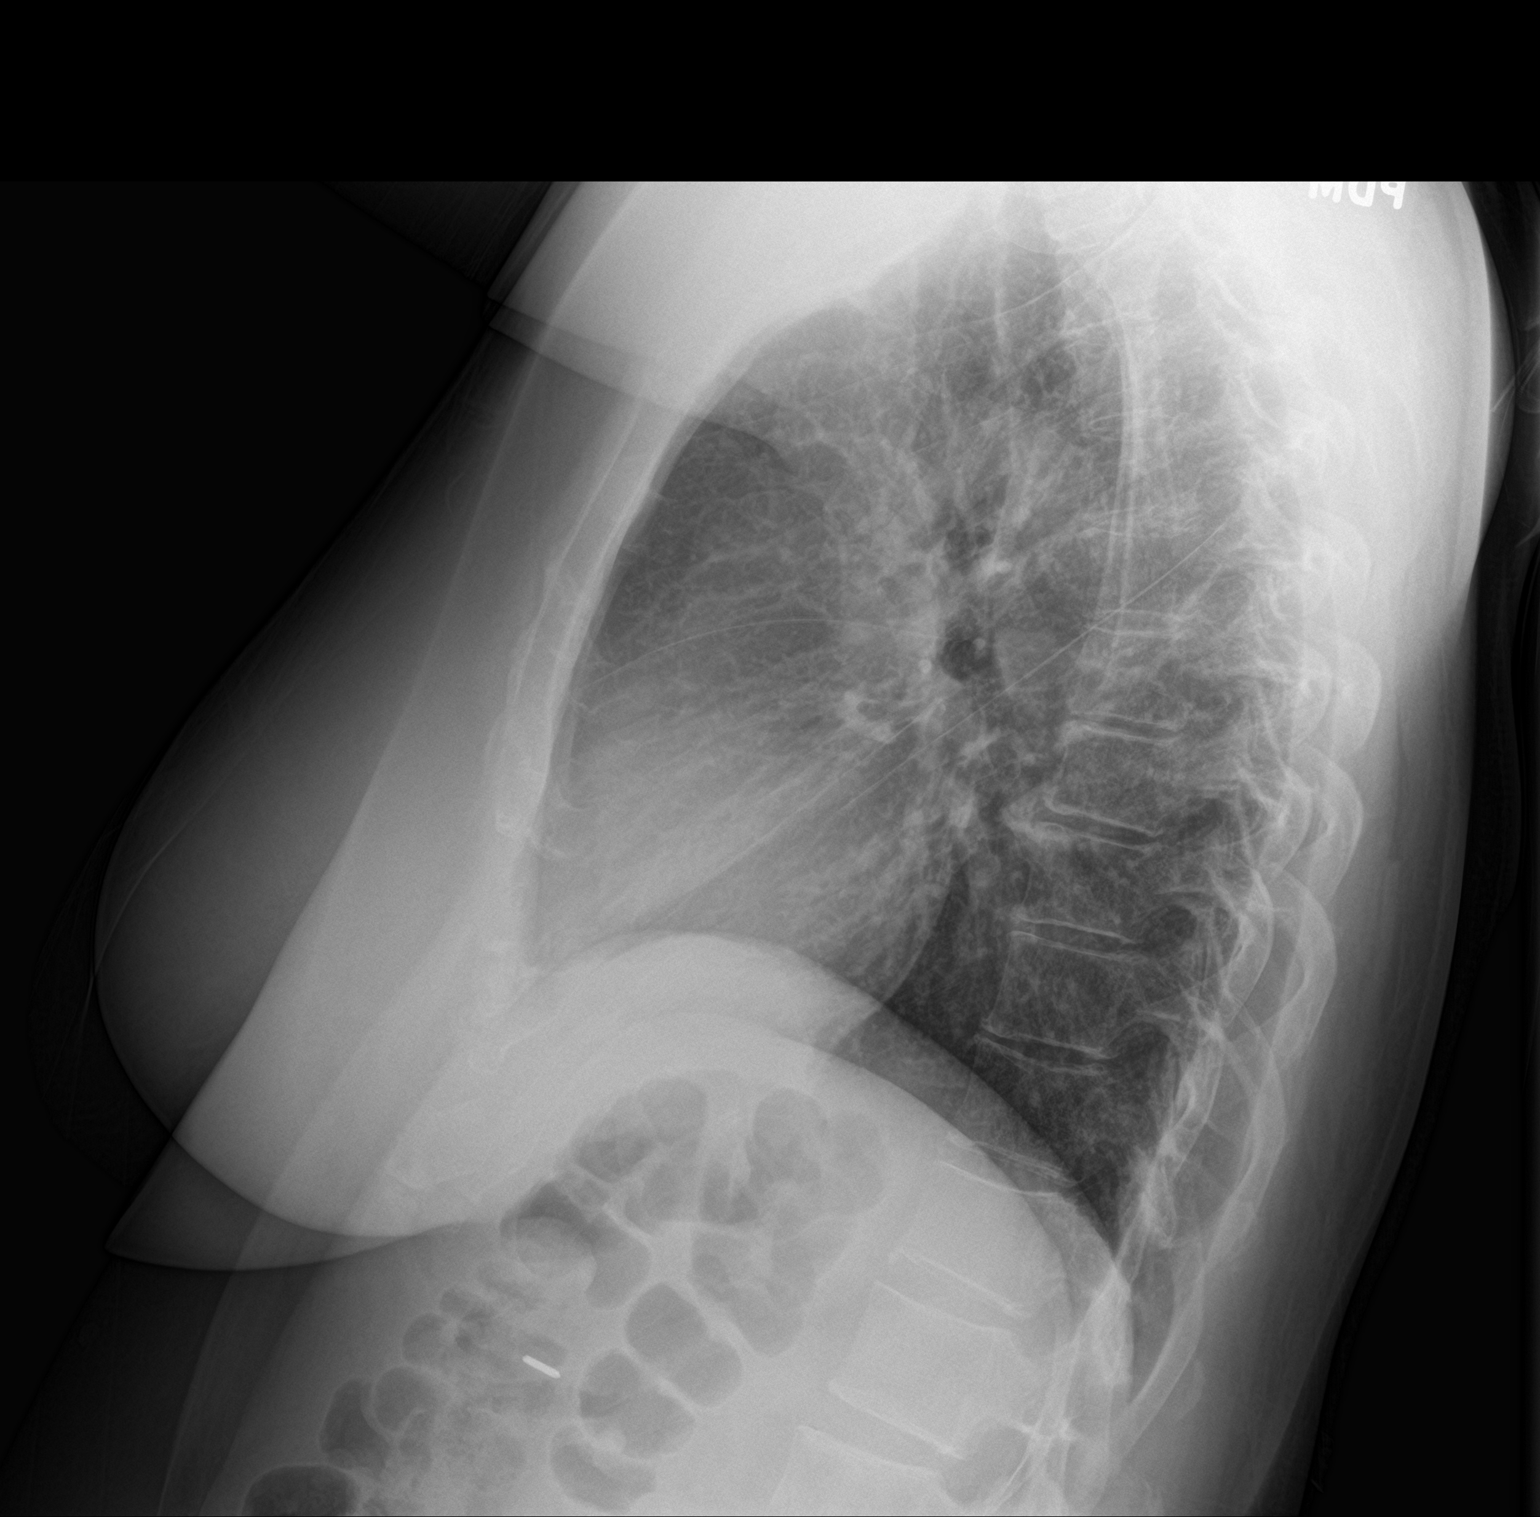

[2 of 2 positions shown; findings below may reference images not displayed]

FINDINGS: The heart size and mediastinal contours are within normal limits.
Both lungs are clear. The visualized skeletal structures are
unremarkable.
IMPRESSION: No active cardiopulmonary disease.

These results were discussed by telephone at the time of
interpretation on 03/15/2021 at [DATE] with provider Dr. TALUCOD
MOATSHE .

## 2023-04-05 IMAGING — MG MM DIGITAL SCREENING BILAT W/ TOMO AND CAD
8 series · 8 of 24 positions shown · non-contrast
Comparison: Previous exam(s).

CLINICAL DATA: Screening.

EXAM:
DIGITAL SCREENING BILATERAL MAMMOGRAM WITH TOMOSYNTHESIS AND CAD
TECHNIQUE: Bilateral screening digital craniocaudal and mediolateral oblique
mammograms were obtained. Bilateral screening digital breast
tomosynthesis was performed. The images were evaluated with
computer-aided detection.

[R MLO synth-2D]
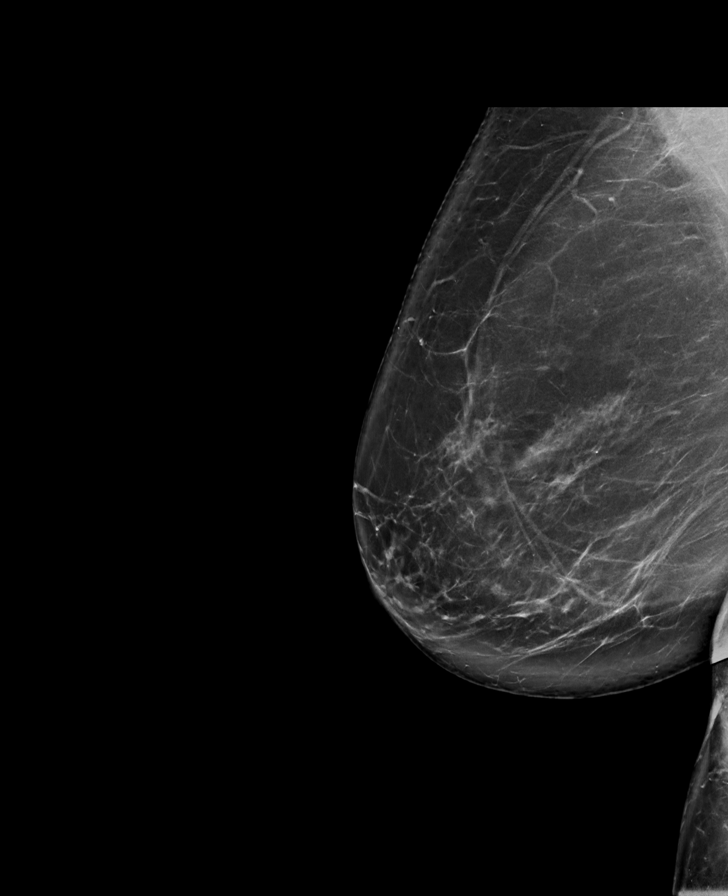

[L MLO synth-2D]
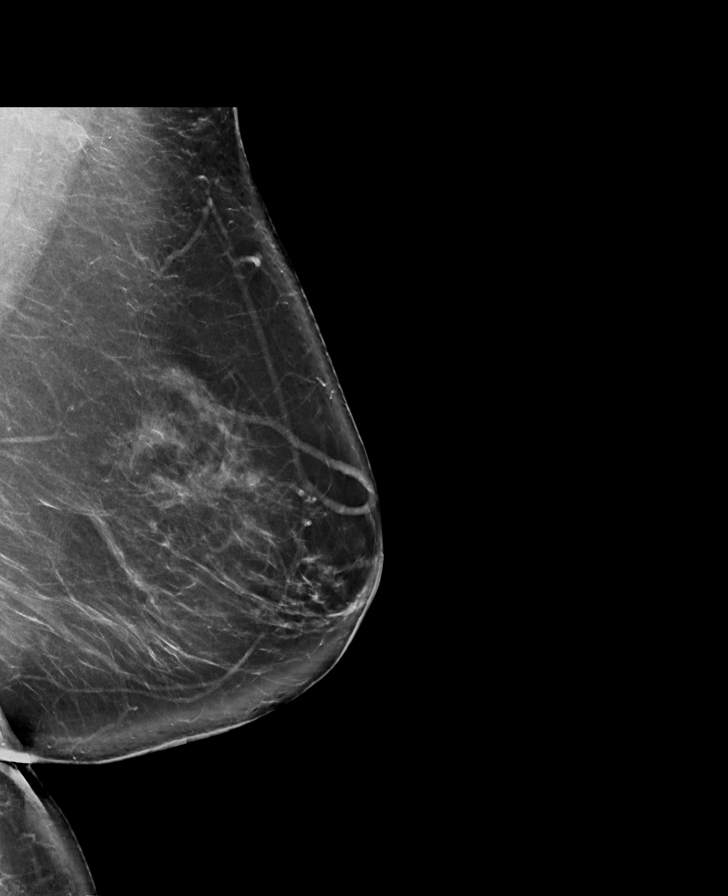

[L CC synth-2D]
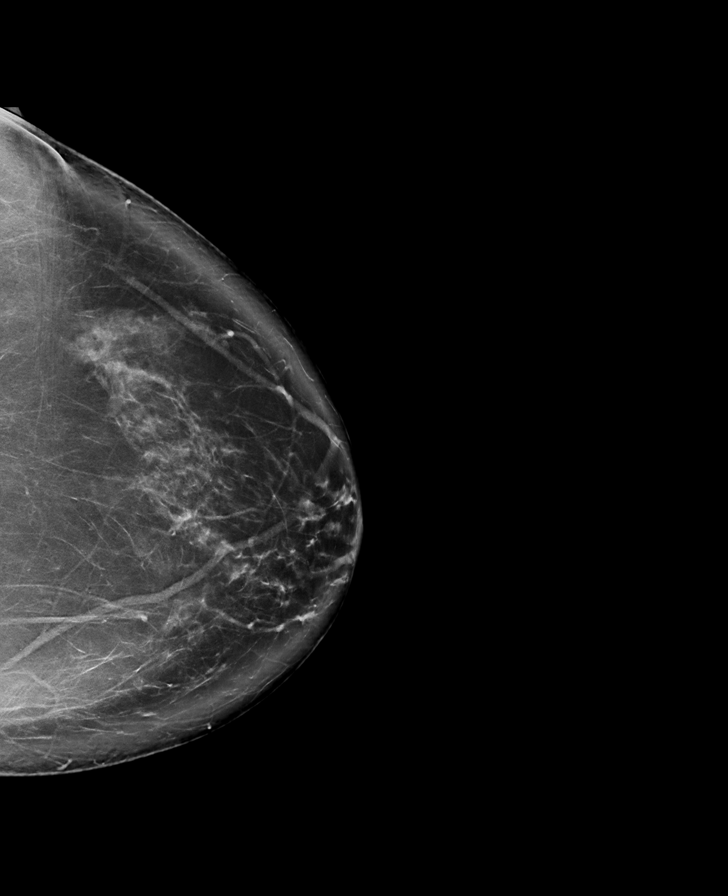

[R CC synth-2D]
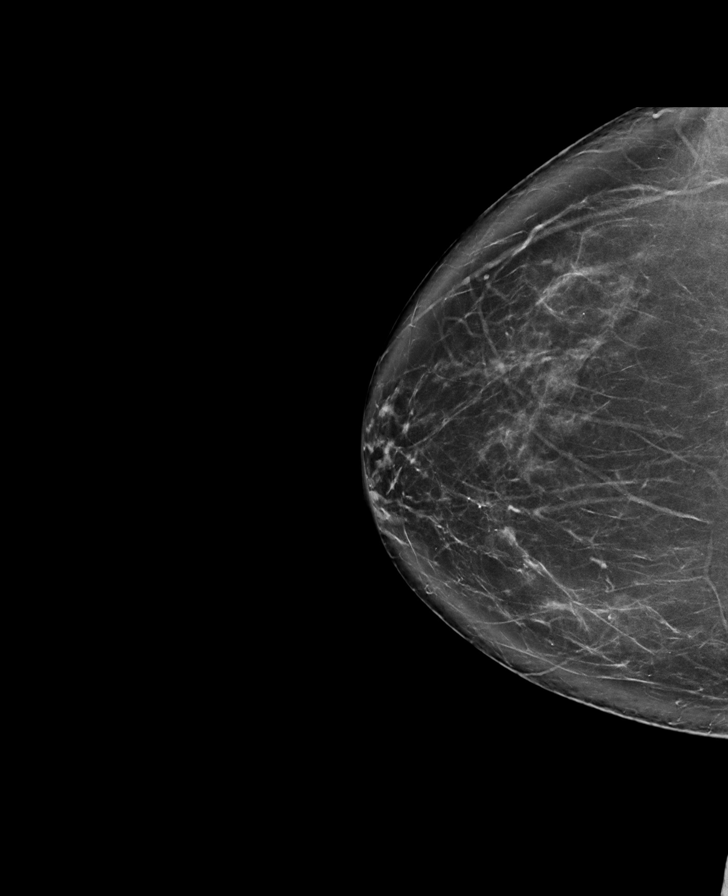

[R MLO tomo · tomo slice 49/96.0]
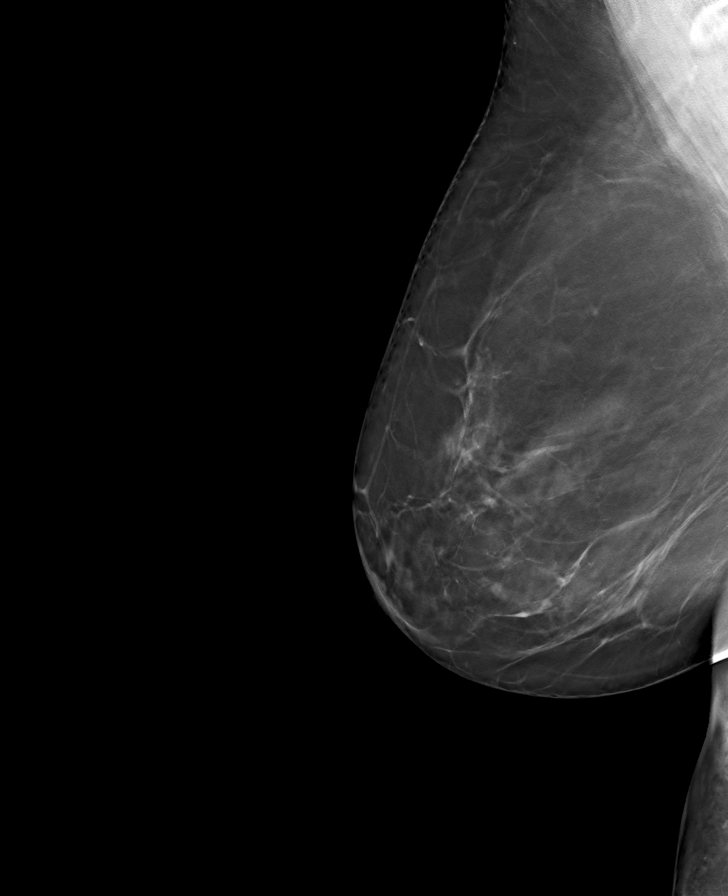

[L CC tomo · tomo slice 50/99.0]
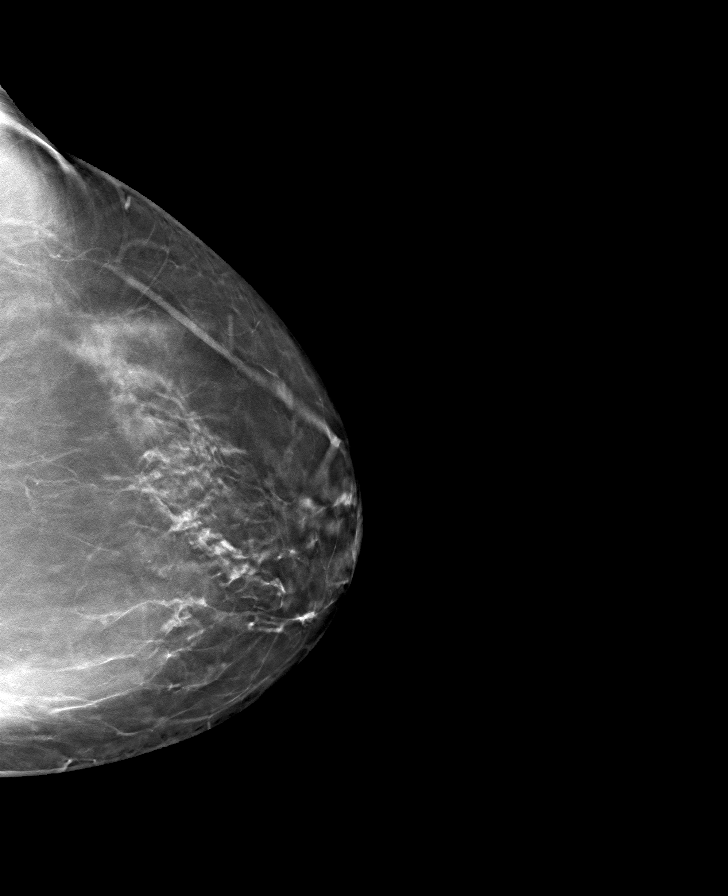

[R CC tomo · tomo slice 47/93.0]
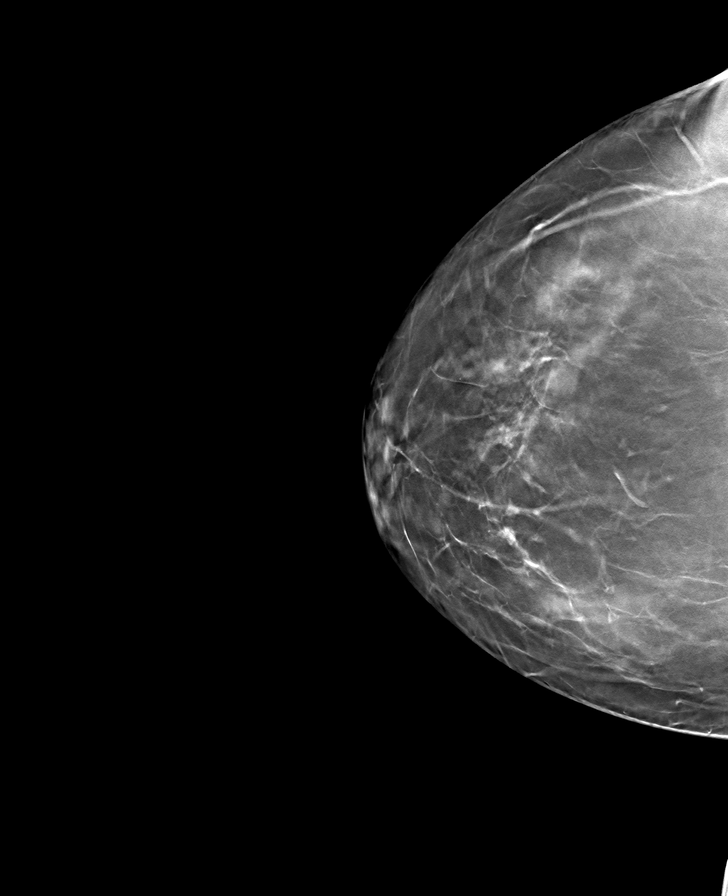

[L MLO tomo · tomo slice 51/101.0]
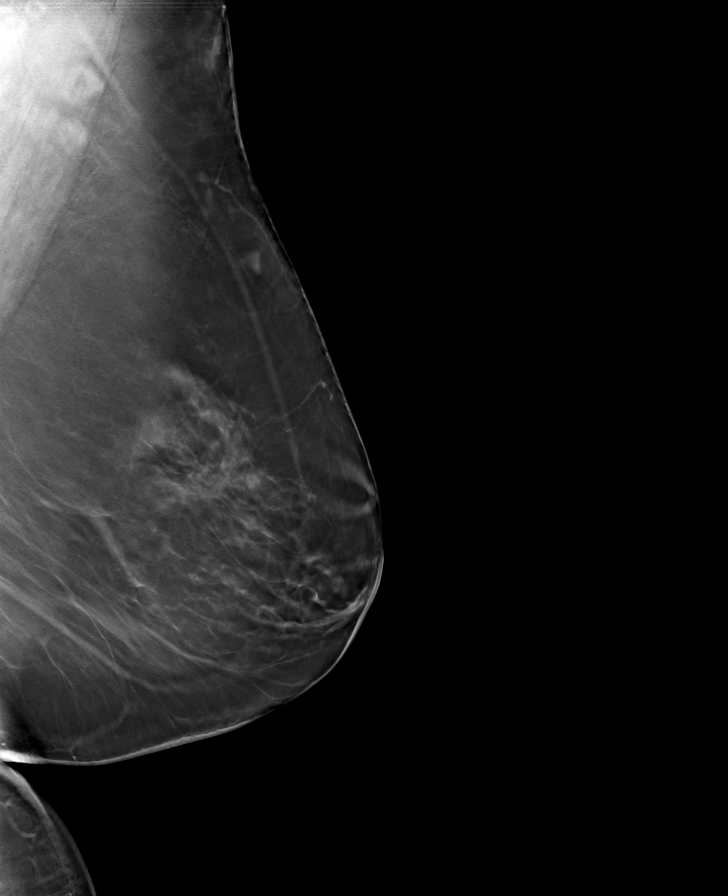

[8 of 24 positions shown; findings below may reference images not displayed]

ACR Breast Density Category b: There are scattered areas of
fibroglandular density.
FINDINGS: There are no findings suspicious for malignancy.
IMPRESSION: No mammographic evidence of malignancy. A result letter of this
screening mammogram will be mailed directly to the patient.

RECOMMENDATION:
Screening mammogram in one year. (Code:51-O-LD2)

BI-RADS CATEGORY  1: Negative.

## 2023-08-14 ENCOUNTER — Other Ambulatory Visit: Payer: Self-pay | Admitting: Family Medicine

## 2023-08-14 DIAGNOSIS — Z1231 Encounter for screening mammogram for malignant neoplasm of breast: Secondary | ICD-10-CM

## 2023-08-31 ENCOUNTER — Ambulatory Visit
Admission: RE | Admit: 2023-08-31 | Discharge: 2023-08-31 | Disposition: A | Discharge status: Home / Self Care | Source: Ambulatory Visit | Attending: Family Medicine | Admitting: Family Medicine

## 2023-08-31 DIAGNOSIS — Z1231 Encounter for screening mammogram for malignant neoplasm of breast: Secondary | ICD-10-CM

## 2024-05-22 ENCOUNTER — Telehealth (INDEPENDENT_AMBULATORY_CARE_PROVIDER_SITE_OTHER): Payer: Self-pay

## 2024-05-22 NOTE — Telephone Encounter (Signed)
 Patient called stated that he doctor sent in a referral and told her to call us  to schedule her an appointment. Please advise.

## 2024-06-19 ENCOUNTER — Institutional Professional Consult (permissible substitution) (INDEPENDENT_AMBULATORY_CARE_PROVIDER_SITE_OTHER): Admitting: Otolaryngology
